# Patient Record
Sex: Male | Born: 1965 | Race: Black or African American | Hispanic: No | Marital: Married | Smoking: Current some day smoker
Health system: Southern US, Community
[De-identification: ages and names within clinical notes are randomized; demographics above are authoritative.]

## PROBLEM LIST (undated history)

## (undated) DIAGNOSIS — J189 Pneumonia, unspecified organism: Secondary | ICD-10-CM

## (undated) DIAGNOSIS — G43901 Migraine, unspecified, not intractable, with status migrainosus: Principal | ICD-10-CM

## (undated) DIAGNOSIS — J449 Chronic obstructive pulmonary disease, unspecified: Secondary | ICD-10-CM

## (undated) HISTORY — PX: FINGER SURGERY: SHX640

## (undated) HISTORY — PX: STOMACH SURGERY: SHX791

## (undated) HISTORY — DX: Migraine, unspecified, not intractable, with status migrainosus: G43.901

---

## 2002-07-28 ENCOUNTER — Emergency Department (HOSPITAL_COMMUNITY): Admission: EM | Admit: 2002-07-28 | Discharge: 2002-07-28 | Payer: Self-pay | Admitting: Emergency Medicine

## 2005-03-30 ENCOUNTER — Emergency Department (HOSPITAL_COMMUNITY): Admission: EM | Admit: 2005-03-30 | Discharge: 2005-03-30 | Payer: Self-pay | Admitting: Emergency Medicine

## 2006-05-20 ENCOUNTER — Emergency Department (HOSPITAL_COMMUNITY): Admission: EM | Admit: 2006-05-20 | Discharge: 2006-05-21 | Payer: Self-pay | Admitting: Emergency Medicine

## 2006-12-09 ENCOUNTER — Emergency Department (HOSPITAL_COMMUNITY): Admission: EM | Admit: 2006-12-09 | Discharge: 2006-12-10 | Payer: Self-pay | Admitting: Emergency Medicine

## 2008-04-08 ENCOUNTER — Emergency Department (HOSPITAL_COMMUNITY): Admission: EM | Admit: 2008-04-08 | Discharge: 2008-04-08 | Payer: Self-pay | Admitting: Emergency Medicine

## 2009-02-12 ENCOUNTER — Emergency Department (HOSPITAL_COMMUNITY): Admission: EM | Admit: 2009-02-12 | Discharge: 2009-02-12 | Payer: Self-pay | Admitting: Emergency Medicine

## 2009-02-17 ENCOUNTER — Inpatient Hospital Stay (HOSPITAL_COMMUNITY): Admission: EM | Admit: 2009-02-17 | Discharge: 2009-02-18 | Payer: Self-pay | Admitting: Emergency Medicine

## 2009-02-23 ENCOUNTER — Inpatient Hospital Stay (HOSPITAL_COMMUNITY): Admission: AD | Admit: 2009-02-23 | Discharge: 2009-02-25 | Payer: Self-pay

## 2009-02-24 ENCOUNTER — Encounter (INDEPENDENT_AMBULATORY_CARE_PROVIDER_SITE_OTHER): Payer: Self-pay | Admitting: General Surgery

## 2009-05-31 ENCOUNTER — Emergency Department (HOSPITAL_COMMUNITY): Admission: EM | Admit: 2009-05-31 | Discharge: 2009-05-31 | Payer: Self-pay | Admitting: Emergency Medicine

## 2009-09-01 ENCOUNTER — Emergency Department (HOSPITAL_COMMUNITY): Admission: EM | Admit: 2009-09-01 | Discharge: 2009-09-01 | Payer: Self-pay | Admitting: Emergency Medicine

## 2010-01-22 ENCOUNTER — Emergency Department (HOSPITAL_COMMUNITY): Admission: EM | Admit: 2010-01-22 | Discharge: 2010-01-22 | Payer: Self-pay | Admitting: Emergency Medicine

## 2010-01-24 ENCOUNTER — Ambulatory Visit (HOSPITAL_COMMUNITY): Admission: RE | Admit: 2010-01-24 | Discharge: 2010-01-24 | Payer: Self-pay | Admitting: Orthopedic Surgery

## 2010-08-02 ENCOUNTER — Emergency Department (HOSPITAL_COMMUNITY): Admission: EM | Admit: 2010-08-02 | Discharge: 2010-08-02 | Payer: Self-pay | Admitting: Emergency Medicine

## 2010-08-22 ENCOUNTER — Emergency Department (HOSPITAL_COMMUNITY): Admission: EM | Admit: 2010-08-22 | Discharge: 2010-08-22 | Payer: Self-pay | Admitting: Emergency Medicine

## 2011-02-21 LAB — COMPREHENSIVE METABOLIC PANEL
ALT: 17 U/L (ref 0–53)
AST: 18 U/L (ref 0–37)
Albumin: 3.5 g/dL (ref 3.5–5.2)
BUN: 7 mg/dL (ref 6–23)
Calcium: 9.1 mg/dL (ref 8.4–10.5)
Creatinine, Ser: 0.9 mg/dL (ref 0.4–1.5)
GFR calc Af Amer: >60 mL/min (ref >60)
Potassium: 4.1 mEq/L (ref 3.5–5.1)
Sodium: 137 mEq/L (ref 135–145)

## 2011-02-21 LAB — DIFFERENTIAL
Basophils Absolute: 0 10*3/uL (ref 0.0–0.1)
Basophils Relative: 1 % (ref 0–1)
Eosinophils Relative: 4 % (ref 0–5)
Lymphocytes Relative: 23 % (ref 12–46)
Lymphs Abs: 1.3 10*3/uL (ref 0.7–4.0)
Neutro Abs: 3.9 10*3/uL (ref 1.7–7.7)
Neutrophils Relative %: 69 % (ref 43–77)

## 2011-02-21 LAB — CBC
HCT: 38.9 % — ABNORMAL LOW (ref 39.0–52.0)
Hemoglobin: 13.4 g/dL (ref 13.0–17.0)
MCV: 90.4 fL (ref 78.0–100.0)
RBC: 4.3 MIL/uL (ref 4.22–5.81)

## 2011-03-15 LAB — URINALYSIS, ROUTINE W REFLEX MICROSCOPIC
Bilirubin Urine: NEGATIVE
Bilirubin Urine: NEGATIVE
Glucose, UA: NEGATIVE mg/dL
Hgb urine dipstick: NEGATIVE
Ketones, ur: NEGATIVE mg/dL
Nitrite: NEGATIVE
Nitrite: NEGATIVE
Protein, ur: NEGATIVE mg/dL
Specific Gravity, Urine: 1.019 (ref 1.005–1.030)
pH: 5.5 (ref 5.0–8.0)

## 2011-03-15 LAB — CBC
Hemoglobin: 14.6 g/dL (ref 13.0–17.0)
Hemoglobin: 15.1 g/dL (ref 13.0–17.0)
MCHC: 35 g/dL (ref 30.0–36.0)
MCV: 90.7 fL (ref 78.0–100.0)
MCV: 91.9 fL (ref 78.0–100.0)
RBC: 4.61 MIL/uL (ref 4.22–5.81)
RBC: 4.77 MIL/uL (ref 4.22–5.81)
RDW: 14.2 % (ref 11.5–15.5)
WBC: 5.1 10*3/uL (ref 4.0–10.5)
WBC: 6.9 10*3/uL (ref 4.0–10.5)
WBC: 7.9 10*3/uL (ref 4.0–10.5)

## 2011-03-15 LAB — COMPREHENSIVE METABOLIC PANEL
ALT: 14 U/L (ref 0–53)
ALT: 19 U/L (ref 0–53)
AST: 15 U/L (ref 0–37)
AST: 25 U/L (ref 0–37)
Alkaline Phosphatase: 69 U/L (ref 39–117)
Alkaline Phosphatase: 82 U/L (ref 39–117)
BUN: 4 mg/dL — ABNORMAL LOW (ref 6–23)
BUN: 7 mg/dL (ref 6–23)
CO2: 27 mEq/L (ref 19–32)
CO2: 26 mEq/L (ref 19–32)
Calcium: 8.9 mg/dL (ref 8.4–10.5)
Calcium: 9.4 mg/dL (ref 8.4–10.5)
Chloride: 104 mEq/L (ref 96–112)
Chloride: 106 mEq/L (ref 96–112)
Creatinine, Ser: 0.91 mg/dL (ref 0.4–1.5)
GFR calc Af Amer: >60 mL/min (ref >60)
GFR calc non Af Amer: >60 mL/min (ref >60)
Glucose, Bld: 85 mg/dL (ref 70–99)
Glucose, Bld: 98 mg/dL (ref 70–99)
Potassium: 3.7 mEq/L (ref 3.5–5.1)
Potassium: 3.8 mEq/L (ref 3.5–5.1)
Sodium: 136 mEq/L (ref 135–145)
Sodium: 141 mEq/L (ref 135–145)
Total Bilirubin: 0.5 mg/dL (ref 0.3–1.2)
Total Bilirubin: 0.5 mg/dL (ref 0.3–1.2)
Total Bilirubin: 0.8 mg/dL (ref 0.3–1.2)
Total Protein: 6.2 g/dL (ref 6.0–8.3)

## 2011-03-15 LAB — DIFFERENTIAL
Basophils Absolute: 0 10*3/uL (ref 0.0–0.1)
Basophils Absolute: 0 10*3/uL (ref 0.0–0.1)
Basophils Relative: 0 % (ref 0–1)
Basophils Relative: 0 % (ref 0–1)
Eosinophils Absolute: 0.4 10*3/uL (ref 0.0–0.7)
Eosinophils Relative: 5 % (ref 0–5)
Eosinophils Relative: 5 % (ref 0–5)
Lymphocytes Relative: 28 % (ref 12–46)
Lymphs Abs: 1.9 10*3/uL (ref 0.7–4.0)
Lymphs Abs: 1.5 10*3/uL (ref 0.7–4.0)
Neutrophils Relative %: 72 % (ref 43–77)

## 2011-03-15 LAB — LIPASE, BLOOD: Lipase: 21 U/L (ref 11–59)

## 2011-03-30 ENCOUNTER — Emergency Department (HOSPITAL_COMMUNITY): Payer: BC Managed Care – PPO

## 2011-03-30 ENCOUNTER — Emergency Department (HOSPITAL_COMMUNITY)
Admission: EM | Admit: 2011-03-30 | Discharge: 2011-03-30 | Disposition: A | Discharge status: Home / Self Care | Payer: BC Managed Care – PPO | Attending: Emergency Medicine | Admitting: Emergency Medicine

## 2011-03-30 DIAGNOSIS — M67919 Unspecified disorder of synovium and tendon, unspecified shoulder: Secondary | ICD-10-CM | POA: Insufficient documentation

## 2011-03-30 DIAGNOSIS — M719 Bursopathy, unspecified: Secondary | ICD-10-CM | POA: Insufficient documentation

## 2011-03-30 DIAGNOSIS — T148XXA Other injury of unspecified body region, initial encounter: Secondary | ICD-10-CM | POA: Insufficient documentation

## 2011-03-30 DIAGNOSIS — F17200 Nicotine dependence, unspecified, uncomplicated: Secondary | ICD-10-CM | POA: Insufficient documentation

## 2011-03-30 DIAGNOSIS — IMO0002 Reserved for concepts with insufficient information to code with codable children: Secondary | ICD-10-CM | POA: Insufficient documentation

## 2011-04-17 NOTE — Discharge Summary (Signed)
NAME:  JUD, FANGUY NO.:  [id number]   MEDICAL RECORD NO.:  [id number]          PATIENT TYPE:  INP   LOCATION:  5127                         FACILITY:  [facility]   PHYSICIAN:  Cherylynn Ridges, M.D.    DATE OF BIRTH:  March 22, 1966   DATE OF ADMISSION:  02/23/2009  DATE OF DISCHARGE:  02/25/2009                               DISCHARGE SUMMARY   CHIEF COMPLAINT/REASON FOR ADMISSION:  Mr. Odriscoll is a male patient  well known to __________ service from initial evaluation last week.  The  patient presented with abdominal and rectal pain.  He had two distinct  problems, the main problem at the time seemed to be more of his rectal  pain.  He had hemorrhoids from having diarrhea for 2-3 weeks and  was  given medication for this problem and given a work note to remain out of  work because of these issues.  He also was found to have abdominal pain  and a HIDA scan did reveal biliary dyskinesia.  He also apparently had  calcified gallstones with normal common bile duct.  This was confirmed  on ultrasound.  The patient's ejection fraction on HIDA scan was 10%.  The cause of these symptoms   Dictation ended at this point.      Allison L. Rennis Harding, N.P.      Cherylynn Ridges, M.D.     ALE/MEDQ  D:  02/25/2009  T:  02/25/2009  Job:  [id number]

## 2011-04-17 NOTE — Op Note (Signed)
NAME:  [name], [name] NO.:  [id number]   MEDICAL RECORD NO.:  [id number]          PATIENT TYPE:  INP   LOCATION:  5127                         FACILITY:  [facility]   PHYSICIAN:  Cherylynn Ridges, M.D.    DATE OF BIRTH:  1966-09-04   DATE OF PROCEDURE:  02/24/2009  DATE OF DISCHARGE:                               OPERATIVE REPORT   PREOPERATIVE DIAGNOSIS:  Symptomatic cholelithiasis and biliary colic.   POSTOPERATIVE DIAGNOSIS:  Symptomatic cholelithiasis and biliary colic.   PROCEDURE:  Laparoscopic cholecystectomy with cholangiogram.   SURGEON:  Cherylynn Ridges, M.D.   ASSISTANT:  Letha Cape, the nurse practitioner.   ANESTHESIA:  General endotracheal.   ESTIMATED BLOOD LOSS:  Less than 20 mL.   COMPLICATION:  None.   CONDITION:  Stable.   FINDINGS:  Some edema in the gallbladder bed.  Normal intraoperative  cholangiogram.   INDICATION FOR OPERATION:  The patient is a 45 year old, readmitted with  abdominal pain in the right upper quadrant associated with nausea,  vomiting, and diarrhea who now comes in for an urgent, but elective  laparoscopic cholecystectomy.   OPERATION:  The patient was taken to the operating room, placed on table  in the supine position.  After an adequate general endotracheal  anesthetic was administered, he was prepped and draped in usual sterile  manner exposing the midline in the right upper quadrant.   A supraumbilical midline incision was made using #15 blade and taken  down to the midline fascia.  The fascia was grabbed with Kocher clamps,  and we incised the fascia between the clamps using 15 blade into the  preperitoneal space.  We bluntly dissected down through the  preperitoneal space into the peritoneal cavity and once this was done, a  pursestring suture of 0 Vicryl was passed around the fascial opening,  securing and a Hasson cannula which was subsequently passed.  Once the  Hasson cannula was secured in place, we  insufflated carbon dioxide gas  up to a maximum intraabdominal pressure of 15 mmHg.   Once the abdomen was filled with gas, we placed 2 right upper quadrant 5-  mm cannulas and a subxiphoid, eleven 12-mm cannula under direct vision.  With all cannulas in place, the patient was placed in reverse  Trendelenburg and left side was tilted down and dissection begun.   The dome of the gallbladder which was enlarged, was retracted towards  the right upper quadrant and into the abdominal wall.  The second grasp  was passed on the infundibulum.  We dissected out the peritoneum  overlying the triangle of Calot, and hepatoduodenal triangle exposing  the cystic duct and the cystic artery.  The cystic artery was clipped x3  along the remaining side and once along the gallbladder side.  The  cystic duct was clipped along the gallbladder side then we made a  cholecystodochotomy using laparoscopic scissors in which a cook catheter  which had been passed through the anterior abdominal wall was passed in  order to perform the cholangiogram.   The cholangiogram showed good flowing to the duodenum, good proximal  filling, no filling defects, and no dilatation.   Once cholangiogram was completed, the clip was removed securing the  catheter in place.  We endoclip the distal cystic duct x2 and then  transected it.   We dissected out the gallbladder from its bed with minimal difficulty.  We used an EndoCatch bag to retrieve it from the supraumbilical site.  Because of its large size, we had to open the fascia slightly, however,  we were able to close that with a pursestring suture which was in place.   Once the gallbladder was removed, then supraumbilical fascial site was  closed.  We irrigated the abdominal cavity and gallbladder fossa with  saline.  All fluid and gas was aspirated from the site.  There was  minimal bleeding, no bowel staining.  Once all gas and fluid were  removed, we removed all  cannulas.   The skin was closed with all sites using running subcuticular stitch of  4-0 Monocryl except for the 2 lateral cannula sites, which were closed  with Dermabond only.  Marcaine 0.5% with epi was injected at all sites.  Dermabond, Steri-Strips, and Tegaderm was used to complete all dressing.  All needle counts, sponge counts, and instrument counts were correct.      Cherylynn Ridges, M.D.  Electronically Signed     JOW/MEDQ  D:  02/24/2009  T:  02/25/2009  Job:  [id number]
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
## 2011-04-17 NOTE — Discharge Summary (Signed)
NAME:  [name], [name] NO.:  [id number]   MEDICAL RECORD NO.:  [id number]          PATIENT TYPE:  INP   LOCATION:  5127                         FACILITY:  [facility]   PHYSICIAN:  Cherylynn Ridges, M.D.    DATE OF BIRTH:  [birth year]   DATE OF ADMISSION:  02/23/2009  DATE OF DISCHARGE:  02/25/2009                               DISCHARGE SUMMARY   CHIEF COMPLAINT/REASON FOR ADMISSION:  [name] is a 45 year old male  patient who was evaluated last week at the hospital for two different  problems, the first being abdominal pain related to cholelithiasis and  biliary dyskinesia with ultrasound demonstrated stones with no problems  with the common bile duct and HIDA scan demonstrating a low ejection  fraction of 10%.  The patient had no acute cholecystitis.  He was also  having difficulty with diarrhea and hemorrhoids and was having  significant pain from this problem, was given medications for this and  was given a work note to remain out of work and plans were to discharge  the patient home and return this week for elective cholecystectomy.  Please note that because we had a high acuity since this last week in  the hospital, the patient was unable to have operative procedure within  the 24-hour time period and otherwise was not deemed appropriate for  remaining in the hospital.   The patient was readmitted on the stated date with plans to undergo  cholecystectomy procedure.   ADMITTING DIAGNOSIS:  Biliary dyskinesia with cholelithiasis,  symptomatic.   HOSPITAL COURSE:  The patient was admitted on February 23, 2009 in  preparation for laparoscopic cholecystectomy.  He was subsequently taken  to the OR on through February 24, 2009 where he underwent a laparoscopic  cholecystectomy.  In the immediate postop period when he first returned  to the surgical floor, he did have some skin bleeding that required  dressing reinforcement but by postop day #1 this had resolved.  The  patient's main complaint on postop day #1 was inadequate pain control.  Otherwise, he was tolerating a diet and was deemed appropriate for  discharge home.   FINAL DISCHARGE DIAGNOSES:  1. Abdominal pain secondary to biliary dyskinesia and cholelithiasis,      symptomatic.  2. Status post uncomplicated laparoscopic cholecystectomy with normal      intraoperative cholangiogram.   DISCHARGE MEDICATIONS:  The patient will resume the following home  medications.  Belladonna phenobarbital 1 tablet as needed, to control your diarrhea.   NEW MEDICATIONS:  1. Percocet 5/325 one to two tablets every 4 hours as needed for pain,      #40 dispensed with no refills.  2. Ibuprofen 600 mg t.i.d. p.r.n. pain.  May take an additional      Percocet, always take with food or snacks.   INSTRUCTIONS:  The patient is to return to work in 2 weeks, note has  been given covering the patient from the period of February 18, 2009  through the next 2 weeks.   DIET:  No restrictions.   WOUND CARE:  Remove bandages over the abdomen in  5 days.   ACTIVITY:  Increase activity slowly and walk up steps.  May shower.  No  lifting greater than 10 pounds for 2 weeks.  No driving 1 week.   ADDITIONAL INSTRUCTIONS:  You are call the surgeon's office if;  A.  Fever greater than or equal to 101.5 degrees Fahrenheit.  B.  New or increased belly pain.  C.  Redness, drainage from wounds.  D.  Nausea, vomiting, or diarrhea.   FOLLOWUP APPOINTMENTS:  The patient [facility] to [facility] Peter Chapel[facility]-C [facility] 08, 2009 at 2:30 p.m.  He is to arrive at  2:15 p.m.      [name], N.P.      Cherylynn Ridges, M.D.  Electronically Signed    ALE/MEDQ  D:  02/25/2009  T:  02/25/2009  Job:  [id number]
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
## 2011-11-21 DIAGNOSIS — G43909 Migraine, unspecified, not intractable, without status migrainosus: Secondary | ICD-10-CM | POA: Insufficient documentation

## 2011-11-22 ENCOUNTER — Emergency Department (HOSPITAL_COMMUNITY)
Admission: EM | Admit: 2011-11-22 | Discharge: 2011-11-22 | Disposition: A | Discharge status: Home / Self Care | Payer: BC Managed Care – PPO | Attending: Emergency Medicine | Admitting: Emergency Medicine

## 2011-11-22 ENCOUNTER — Encounter: Payer: Self-pay | Admitting: Emergency Medicine

## 2011-11-22 DIAGNOSIS — G43909 Migraine, unspecified, not intractable, without status migrainosus: Secondary | ICD-10-CM

## 2011-11-22 MED ORDER — METOCLOPRAMIDE HCL 5 MG/ML IJ SOLN
10.0000 mg | Freq: Once | INTRAMUSCULAR | Status: DC
  Filled 2011-11-22: qty 2

## 2011-11-22 MED ORDER — DEXAMETHASONE SODIUM PHOSPHATE 10 MG/ML IJ SOLN
10.0000 mg | Freq: Once | INTRAMUSCULAR | Status: DC
  Filled 2011-11-22: qty 1

## 2011-11-22 MED ORDER — METOCLOPRAMIDE HCL 5 MG/ML IJ SOLN
10.0000 mg | Freq: Once | INTRAMUSCULAR | Status: AC
  Administered 2011-11-22: 10 mg via INTRAMUSCULAR

## 2011-11-22 MED ORDER — DIPHENHYDRAMINE HCL 50 MG/ML IJ SOLN
25.0000 mg | Freq: Once | INTRAMUSCULAR | Status: AC
  Administered 2011-11-22: 25 mg via INTRAMUSCULAR

## 2011-11-22 MED ORDER — DIPHENHYDRAMINE HCL 50 MG/ML IJ SOLN
25.0000 mg | Freq: Once | INTRAMUSCULAR | Status: DC
  Filled 2011-11-22: qty 1

## 2011-11-22 MED ORDER — DEXAMETHASONE SODIUM PHOSPHATE 10 MG/ML IJ SOLN
10.0000 mg | Freq: Once | INTRAMUSCULAR | Status: AC
  Administered 2011-11-22: 10 mg via INTRAMUSCULAR

## 2011-11-22 NOTE — ED Notes (Signed)
Pt here with c/o headache times 2 days associated with n/v/d s well. Pt admits to having a hx of migraines

## 2011-11-22 NOTE — ED Provider Notes (Signed)
History     CSN: [id number] Arrival date & time: 11/22/2011 12:01 AM   First MD Initiated Contact with Patient 11/22/11 0252      Chief Complaint  Patient presents with  . Headache     HPI  History provided by the pt. The patient is a 45 year old male with history of migraine headaches since the age of 79, who presents today with complaints of similar headaches began 2 days ago. It appeared gradually and is increased is now improved at home with over-the-counter pain medications were adjusted symptoms are similar to past headaches. Patient reports some photophobia. Patient reports having one episode of vomiting with headache for began. Patient has no other significant past medical history. the patient denies fever, chills, sweats.    Past Medical History  Diagnosis Date  . Migraine     Past Surgical History  Procedure Date  . Rotator cuff repair   . Cholecystectomy   . Finger surgery     Family History  Problem Relation Age of Onset  . Hypertension Mother   . Tuberculosis Mother   . Diabetes Mother     History  Substance Use Topics  . Smoking status: Current Some Day Smoker  . Smokeless tobacco: Not on file  . Alcohol Use: No      Review of Systems  Constitutional: Negative for fever and chills.  HENT: Negative for sinus pressure.   Respiratory: Negative for cough and shortness of breath.   Gastrointestinal: Positive for nausea and vomiting.  Neurological: Positive for headaches.  All other systems reviewed and are negative.    Allergies  Morphine and related  Home Medications  No current outpatient prescriptions on file.  BP 124/59  Pulse 76  Temp(Src) 97.4 F (36.3 C) (Oral)  Resp 20  SpO2 100%  Physical Exam  Nursing note and vitals reviewed. Constitutional: He is oriented to person, place, and time. He appears well-developed and well-nourished. No distress.  HENT:  Head: Normocephalic and atraumatic.  Eyes: Conjunctivae and EOM are  normal. Pupils are equal, round, and reactive to light.  Neck: Normal range of motion. Neck supple.       No meningeal signs  Cardiovascular: Normal rate, regular rhythm and normal heart sounds.   Pulmonary/Chest: Effort normal and breath sounds normal.  Abdominal: Soft.  Musculoskeletal: He exhibits no edema and no tenderness.  Neurological: He is alert and oriented to person, place, and time. He has normal strength. No cranial nerve deficit or sensory deficit. Coordination and gait normal.  Skin: Skin is warm.  Psychiatric: He has a normal mood and affect. His behavior is normal.    ED Course  Procedures (including critical care time)    1. Migraine headache       MDM  3:00AM patient seen and evaluated. Patient in no acute distress   Pt having slight improvement.  Pt offered additional tx for HA but pt requests to return home to rest there.  Pt with no red flags for symptoms today and normal exam.     Angus Seller, PA 11/22/11 [phone]

## 2011-11-22 NOTE — ED Notes (Signed)
Pt ready for d/c  Stable. Ambulatory. No issues

## 2011-11-22 NOTE — ED Notes (Signed)
Pt states about 2 days ago at work his head started hurting  Pt states the pain feels like a beating in his head  Pt has hx of migraines  Pt states they come periodically  Pt states he has had nausea, vomiting and diarrhea that started 2 days ago

## 2011-11-23 NOTE — ED Provider Notes (Signed)
Medical screening examination/treatment/procedure(s) were performed by non-physician practitioner and as supervising physician I was immediately available for consultation/collaboration.   Vida Roller, MD 11/23/11 [phone]

## 2011-12-04 HISTORY — PX: CHOLECYSTECTOMY: SHX55

## 2011-12-04 HISTORY — PX: ROTATOR CUFF REPAIR: SHX139

## 2013-02-21 ENCOUNTER — Encounter (HOSPITAL_COMMUNITY): Payer: Self-pay | Admitting: *Deleted

## 2013-02-21 ENCOUNTER — Emergency Department (HOSPITAL_COMMUNITY)
Admission: EM | Admit: 2013-02-21 | Discharge: 2013-02-21 | Disposition: A | Discharge status: Home / Self Care | Payer: BC Managed Care – PPO | Attending: Emergency Medicine | Admitting: Emergency Medicine

## 2013-02-21 DIAGNOSIS — G43909 Migraine, unspecified, not intractable, without status migrainosus: Secondary | ICD-10-CM

## 2013-02-21 DIAGNOSIS — F17200 Nicotine dependence, unspecified, uncomplicated: Secondary | ICD-10-CM | POA: Insufficient documentation

## 2013-02-21 MED ORDER — METOCLOPRAMIDE HCL 5 MG/ML IJ SOLN
10.0000 mg | Freq: Once | INTRAMUSCULAR | Status: AC
  Administered 2013-02-21: 10 mg via INTRAVENOUS
  Filled 2013-02-21: qty 2

## 2013-02-21 MED ORDER — DIPHENHYDRAMINE HCL 50 MG/ML IJ SOLN
25.0000 mg | Freq: Once | INTRAMUSCULAR | Status: AC
  Administered 2013-02-21: 25 mg via INTRAVENOUS
  Filled 2013-02-21: qty 1

## 2013-02-21 MED ORDER — PREDNISONE 50 MG PO TABS: 50.0000 mg | ORAL_TABLET | Freq: Every day | ORAL | Status: DC

## 2013-02-21 MED ORDER — KETOROLAC TROMETHAMINE 30 MG/ML IJ SOLN
30.0000 mg | Freq: Once | INTRAMUSCULAR | Status: AC
  Administered 2013-02-21: 30 mg via INTRAVENOUS
  Filled 2013-02-21: qty 1

## 2013-02-21 NOTE — ED Notes (Signed)
Patient complains of left eye blurred vision and headache concentrated over his left eye. Patient had an MRI done yesterday at [facility]. Patient has DVD with him.

## 2013-02-21 NOTE — ED Provider Notes (Signed)
History     CSN: [id number]  Arrival date & time 02/21/13  Ernestina Columbia   First MD Initiated Contact with Patient 02/21/13 1931      Chief Complaint  Patient presents with  . Migraine    (Consider location/radiation/quality/duration/timing/severity/associated sxs/prior treatment) HPI Patient presents emergency department with migraine headache.  Patient, states, that he has had a long-standing history of migraine headaches.  Patient, states, that he currently is being treated by his primary Dr. for these and had a recent MRI.  Patient denies visual changes, weakness, numbness, fever, chest pain, shortness of breath, abdominal pain, or vomiting.  Patient, states, that he did not take anything prior to arrival for his symptoms.  Patient, states his headache has Been ongoing for the last 2 weeks.  Past Medical History  Diagnosis Date  . Migraine     Past Surgical History  Procedure Laterality Date  . Rotator cuff repair    . Cholecystectomy    . Finger surgery      Family History  Problem Relation Age of Onset  . Hypertension Mother   . Tuberculosis Mother   . Diabetes Mother     History  Substance Use Topics  . Smoking status: Current Some Day Smoker    Types: Cigarettes  . Smokeless tobacco: Not on file  . Alcohol Use: No      Review of Systems All other systems negative except as documented in the HPI. All pertinent positives and negatives as reviewed in the HPI.  Allergies  Morphine and related  Home Medications   Current Outpatient Rx  Name  Route  Sig  Dispense  Refill  . topiramate (TOPAMAX) 50 MG tablet   Oral   Take 50 mg by mouth 2 (two) times daily.           BP 117/79  Pulse 53  Temp(Src) 97.3 F (36.3 C) (Oral)  Resp 16  SpO2 96%  Physical Exam  Nursing note and vitals reviewed. Constitutional: He is oriented to person, place, and time. He appears well-developed and well-nourished.  HENT:  Head: Normocephalic and atraumatic.  Mouth/Throat:  Oropharynx is clear and moist.  Eyes: EOM are normal. Pupils are equal, round, and reactive to light.  Neck: Normal range of motion. Neck supple.  Cardiovascular: Normal rate, regular rhythm and normal heart sounds.  Exam reveals no gallop and no friction rub.   No murmur heard. Pulmonary/Chest: Effort normal and breath sounds normal.  Neurological: He is alert and oriented to person, place, and time. He exhibits normal muscle tone. Coordination normal.  Skin: Skin is warm and dry.    ED Course  Procedures (including critical care time) Patient is feeling better.  Patient will be referred back to his primary Dr. for further evaluation and care.  Told to return here for any worsening in his condition   MDM          Carlyle Dolly, PA-C 02/21/13 2245

## 2013-02-21 NOTE — ED Notes (Signed)
Patient states his headache has improved after medication administered.

## 2013-02-21 NOTE — ED Notes (Signed)
Pt c/o migraine x 2 weeks, was seen by PCP and given rx but has had no relief.  Had MRI yesterday and has CD with him

## 2013-02-21 NOTE — ED Provider Notes (Signed)
Medical screening examination/treatment/procedure(s) were performed by non-physician practitioner and as supervising physician I was immediately available for consultation/collaboration.   Richardean Canal, MD 02/21/13 [phone]

## 2013-02-26 ENCOUNTER — Ambulatory Visit (INDEPENDENT_AMBULATORY_CARE_PROVIDER_SITE_OTHER): Payer: BC Managed Care – PPO | Admitting: Neurology

## 2013-02-26 ENCOUNTER — Encounter: Payer: Self-pay | Admitting: Neurology

## 2013-02-26 VITALS — BP 146/91 | HR 68 | Ht 68.0 in | Wt 209.0 lb

## 2013-02-26 DIAGNOSIS — G43901 Migraine, unspecified, not intractable, with status migrainosus: Secondary | ICD-10-CM

## 2013-02-26 HISTORY — DX: Migraine, unspecified, not intractable, with status migrainosus: G43.901

## 2013-02-26 MED ORDER — DIVALPROEX SODIUM ER 500 MG PO TB24: 500.0000 mg | ORAL_TABLET | Freq: Every day | ORAL | Status: DC

## 2013-02-26 MED ORDER — VERAPAMIL HCL ER 120 MG PO CP24: 120.0000 mg | ORAL_CAPSULE | Freq: Every day | ORAL | Status: DC

## 2013-02-26 MED ORDER — RIZATRIPTAN BENZOATE 10 MG PO TBDP: 10.0000 mg | ORAL_TABLET | ORAL | Status: DC | PRN

## 2013-02-26 NOTE — Progress Notes (Signed)
[name] is a 47 years old right-handed African American male, referred by his primary care physician Ms. Sheliah Plane for evaluation of headaches  He reported a history of headaches since age 38, he could remember dived down into a swimming pool, when he came back up, he noticed left retro-orbital area severe pounding headaches, he had frequent headache since, left retro-orbital area severe bony headaches with associated light noise sensitivity, nauseous, he could remember his mother took him back-and-forth between the hospital when he was a teenager.  He usually has headaches lasting 2-3 weeks, daily basis, can also have 1 month in between without a headache, he did not notice anything trigger,  He now has this left retro-orbital area headache for 3 weeks now, constant pounding headaches, getting worse at evening time, he works at [employer], require lifting 60-200 pounds, his headache worsening by movement, he presented to the emergency room, his headache failed to improve by Percocet, oxycodone, over-the-counter Aleve, Tylenol,  MRI of the brain noticed mild bihemisphere minimum changes, of the brain was normal  He could not recall the name of the preventive medication he tried before, but has never tried triptan treatment  He has mild gait difficulty due to right knee pain, he has 6 children, 67 years old daughter suffered leukemia, also stroke, had heart failure, is on the list for heart transplant.  Review of Systems  Out of a complete 14 system review, the patient complains of only the following symptoms, and all other reviewed systems are negative.   Constitutional:   N/A Cardiovascular:  N/A Ear/Nose/Throat:  N/A Skin: N/A Eyes: Blurry vision, left eye pain, Respiratory: N/A Gastroitestinal: N/A    Hematology/Lymphatic:  N/A Musculoskeletal:N/A Endocrine:  N/A Neurological: Dizziness Psychiatric:    N/A  PHYSICAL EXAMINATOINS:  Generalized: In no acute distress  Neck: Supple, no carotid  bruits   Cardiac: Regular rate rhythm  Pulmonary: Clear to auscultation bilaterally  Musculoskeletal: No deformity  Neurological examination  Mentation: Alert oriented to time, place, history taking, and causual conversation, light-sensitive  Cranial nerve II-XII: Pupils were equal round reactive to light extraocular movements were full, visual field were full on confrontational test. facial sensation and strength were normal. hearing was intact to finger rubbing bilaterally. Uvula tongue midline.  head turning and shoulder shrug and were normal and symmetric.Tongue protrusion into cheek strength was normal.  Motor: normal tone, bulk and strength.  Sensory: Intact to fine touch, pinprick, preserved vibratory sensation, and proprioception at toes.  Coordination: Normal finger to nose, heel-to-shin bilaterally there was no truncal ataxia  Gait: Rising up from seated position without assistance, normal stance, without trunk ataxia, moderate stride, mild atalgic due to right knee pain, good arm swing, smooth turning, able to perform tiptoe, and heel walking without difficulty.   Romberg signs: Negative  Deep tendon reflexes: Brachioradialis 2/2, biceps 2/2, triceps 2/2, patellar 2/2, Achilles 2/2, plantar responses were flexor bilaterally.  Assessment and plan:  47 years old [place] American male with frequent migraine headaches, essentially normal neurological examination, MRI of brain MRA of brain.  1, preventive medications Depakote XR 500 mg every night, verapamil extended release 120 mg daily 2. abortive treatment, [name] 10 mg as needed 3, return to clinic in one month

## 2013-02-26 NOTE — Patient Instructions (Signed)
Take depakote xr and verapmil daily. [name] as needed.

## 2013-03-03 ENCOUNTER — Telehealth: Payer: Self-pay

## 2013-03-03 NOTE — Telephone Encounter (Signed)
Patient wife called and asked me to call her husband because he was still having migraines daily and he needed apt. Called patient times two and left message for him to call.

## 2013-03-17 ENCOUNTER — Telehealth: Payer: Self-pay | Admitting: Neurology

## 2013-03-17 NOTE — Telephone Encounter (Signed)
Message copied by Elisha Headland on Tue Mar 17, 2013  8:30 AM ------      Message from: Eugenie Birks      Created: Tue Mar 17, 2013  7:38 AM      Regarding: pt wife came in office today       Pt wife showed up in clinic this morning at 7:30 this morning and states the pt is getting worst. He was in the bed all day yesterday and has been sick, not eating, and vomiting and wants to be seen as soon as possible about these symptoms. She also states he could not see out of one of his eyes. I bumped the appt up by a week but they still want to be seen sooner than that please call as soon as possible. ------

## 2013-03-17 NOTE — Telephone Encounter (Signed)
See message about patient being sick below.  Patient was seen on 02-26-13 and wants to be seen sooner.  Please call patient [phone]  Or [phone].

## 2013-03-17 NOTE — Telephone Encounter (Signed)
I have called him, failed to reach him. Please give him an appt for tomorrow. April 16th.

## 2013-03-18 ENCOUNTER — Ambulatory Visit (INDEPENDENT_AMBULATORY_CARE_PROVIDER_SITE_OTHER): Payer: BC Managed Care – PPO | Admitting: Neurology

## 2013-03-18 ENCOUNTER — Telehealth: Payer: Self-pay

## 2013-03-18 ENCOUNTER — Encounter: Payer: Self-pay | Admitting: Neurology

## 2013-03-18 VITALS — BP 122/83 | HR 79 | Wt 210.0 lb

## 2013-03-18 DIAGNOSIS — G43909 Migraine, unspecified, not intractable, without status migrainosus: Secondary | ICD-10-CM

## 2013-03-18 MED ORDER — SUMATRIPTAN SUCCINATE 6 MG/0.5ML ~~LOC~~ SOLN: 6.0000 mg | SUBCUTANEOUS | Status: DC | PRN

## 2013-03-18 MED ORDER — MAGNESIUM OXIDE 400 MG PO TABS: 400.0000 mg | ORAL_TABLET | Freq: Two times a day (BID) | ORAL | Status: DC

## 2013-03-18 MED ORDER — KETOROLAC TROMETHAMINE 60 MG/2ML IM SOLN
60.0000 mg | Freq: Once | INTRAMUSCULAR | Status: AC
  Administered 2013-03-18: 60 mg via INTRAMUSCULAR

## 2013-03-18 MED ORDER — METHYLPREDNISOLONE (PAK) 4 MG PO TABS: ORAL_TABLET | ORAL | Status: DC

## 2013-03-18 MED ORDER — RIBOFLAVIN 100 MG PO TABS: 100.0000 mg | ORAL_TABLET | Freq: Two times a day (BID) | ORAL | Status: DC

## 2013-03-18 NOTE — Patient Instructions (Addendum)
Patient told to rest before work [employer].

## 2013-03-18 NOTE — Telephone Encounter (Signed)
Patient has apt with Dr.Yan  03-18-2013 Dr.Yan worked him in.

## 2013-03-18 NOTE — Progress Notes (Signed)
[name] is a 47 years old right-handed African American male, referred by his primary care physician Dr. Sheliah Plane for evaluation of headaches  He reported a history of headaches since age 19, he could remember dived down into a swimming pool, when he came back up, he noticed left retro-orbital area severe pounding headaches, he had frequent headache since, left retro-orbital area severe bony headaches with associated light noise sensitivity, nauseous, he could remember his mother took him back-and-forth between the hospital when he was a teenager.  He usually has headaches lasting 2-3 weeks, daily basis, can also have 1 month in between without a headache, he did not notice anything trigger,  He now has this left retro-orbital area headache since early March 2014, constant pounding headaches, with superposed electric shocking, getting worse at evening time, he works at [employer], require lifting 60-200 pounds, his headache worsening by movement, he presented to the emergency room, his headache failed to improve by Percocet, oxycodone, over-the-counter Aleve, Tylenol,  MRI of the brain noticed mild bihemisphere minimum changes, of the brain was normal  He could not recall the name of the preventive medication he tried before, but has never tried triptan treatment  He has mild gait difficulty due to right knee pain, he has 6 children, 57 years old daughter suffered leukemia, also stroke, had heart failure, is on the list for heart transplant.  UPDATE Aprill 16th 2014: Verapamil make his chest hurt, he stopped it, he has never tried Depakote, worry about the side effects, he complains today the left retro-orbital area severe pounding headaches, intermixed with sharp electricity shock sensation, 10 out of 10, he is tired of medications, has been taking off work for a few days, very frustrated about his pain, but he is also very suspicious about taking medications. He Is not sure [name] when necessary has helped  him    Review of Systems  Out of a complete 14 system review, the patient complains of only the following symptoms, and all other reviewed systems are negative.   Constitutional:   N/A Cardiovascular:  N/A Ear/Nose/Throat:  N/A Skin: N/A Eyes: Blurry vision, left eye pain, Respiratory: N/A Gastroitestinal: N/A    Hematology/Lymphatic:  N/A Musculoskeletal:N/A Endocrine:  N/A Neurological: Dizziness Psychiatric:    N/A  PHYSICAL EXAMINATOINS:  Generalized: In no acute distress  Neck: Supple, no carotid bruits   Cardiac: Regular rate rhythm  Pulmonary: Clear to auscultation bilaterally  Musculoskeletal: No deformity  Neurological examination  Mentation: Alert oriented to time, place, history taking, and causual conversation, light-sensitive  Cranial nerve II-XII: Pupils were equal round reactive to light extraocular movements were full, visual field were full on confrontational test. facial sensation and strength were normal. hearing was intact to finger rubbing bilaterally. Uvula tongue midline.  head turning and shoulder shrug and were normal and symmetric.Tongue protrusion into cheek strength was normal.  Motor: normal tone, bulk and strength.  Sensory: Intact to fine touch, pinprick, preserved vibratory sensation, and proprioception at toes.  Coordination: Normal finger to nose, heel-to-shin bilaterally there was no truncal ataxia  Gait: Rising up from seated position without assistance, normal stance, without trunk ataxia, moderate stride, mild atalgic due to right knee pain, good arm swing, smooth turning, able to perform tiptoe, and heel walking without difficulty.   Romberg signs: Negative  Deep tendon reflexes: Brachioradialis 2/2, biceps 2/2, triceps 2/2, patellar 2/2, Achilles 2/2, plantar responses were flexor bilaterally.  Assessment and plan: 47 years old [place] American male with frequent migraine headaches, essentially  normal neurological examination,  MRI of brain, MRA of brain.  1, preventive medications Depakote XR 500 mg every night, also magnesium oxide 500 mg twice a day, riboflavin 100mg  bid. 2. imitrex sq prn,  3. Medrol Pak 4. return to clinic in one month

## 2013-03-18 NOTE — Progress Notes (Signed)
Patient here for visit with Dr. Terrace Arabia.  Order for Toradol 60mg  IM.  Under aseptic technique Toradol 60mg  given IM in left deltoid.  Tolerated and band aid applied.

## 2013-03-25 ENCOUNTER — Ambulatory Visit: Payer: Self-pay | Admitting: Neurology

## 2013-03-25 ENCOUNTER — Ambulatory Visit: Payer: BC Managed Care – PPO | Admitting: Neurology

## 2013-03-31 ENCOUNTER — Ambulatory Visit: Payer: BC Managed Care – PPO | Admitting: Neurology

## 2013-06-17 ENCOUNTER — Emergency Department (HOSPITAL_COMMUNITY): Payer: Self-pay

## 2013-06-17 ENCOUNTER — Encounter (HOSPITAL_COMMUNITY): Payer: Self-pay | Admitting: *Deleted

## 2013-06-17 ENCOUNTER — Emergency Department (HOSPITAL_COMMUNITY)
Admission: EM | Admit: 2013-06-17 | Discharge: 2013-06-18 | Disposition: A | Discharge status: Home / Self Care | Payer: Self-pay | Attending: Emergency Medicine | Admitting: Emergency Medicine

## 2013-06-17 DIAGNOSIS — G43901 Migraine, unspecified, not intractable, with status migrainosus: Secondary | ICD-10-CM | POA: Insufficient documentation

## 2013-06-17 DIAGNOSIS — F17200 Nicotine dependence, unspecified, uncomplicated: Secondary | ICD-10-CM | POA: Insufficient documentation

## 2013-06-17 DIAGNOSIS — Y9372 Activity, wrestling: Secondary | ICD-10-CM | POA: Insufficient documentation

## 2013-06-17 DIAGNOSIS — S46911A Strain of unspecified muscle, fascia and tendon at shoulder and upper arm level, right arm, initial encounter: Secondary | ICD-10-CM

## 2013-06-17 DIAGNOSIS — X58XXXA Exposure to other specified factors, initial encounter: Secondary | ICD-10-CM | POA: Insufficient documentation

## 2013-06-17 DIAGNOSIS — Y929 Unspecified place or not applicable: Secondary | ICD-10-CM | POA: Insufficient documentation

## 2013-06-17 DIAGNOSIS — IMO0002 Reserved for concepts with insufficient information to code with codable children: Secondary | ICD-10-CM | POA: Insufficient documentation

## 2013-06-17 DIAGNOSIS — Z7982 Long term (current) use of aspirin: Secondary | ICD-10-CM | POA: Insufficient documentation

## 2013-06-17 MED ORDER — NAPROXEN 500 MG PO TABS: 500.0000 mg | ORAL_TABLET | Freq: Two times a day (BID) | ORAL | Status: DC

## 2013-06-17 NOTE — ED Notes (Signed)
Pt arm wrestling early this morning; injured right elbow; increased pain all day

## 2013-06-17 NOTE — ED Provider Notes (Signed)
History    CSN: [id number] Arrival date & time 06/17/13  2240  First MD Initiated Contact with Patient 06/17/13 2340     Chief Complaint  Patient presents with  . Arm Injury   HPI  History provided by the patient. Patient is a 47 year old male with history of right rotator cuff surgery and biceps tendon rupture who presents with complaints of right elbow pain and injury. Patient states he had returned home from work earlier this evening and was with several friends. They began having arm wrestling contest and patient was competing when he started to have "tearing" and pain to his right elbow as he was losing. He did not hear any sudden or loud pop it. He has continued to have pain and soreness to his medial forearm and elbow area. Pain is worse with some movements of the elbow and wrist. Denies any reduced range of motion. Denies any weakness or numbness in the hand or fingers. Denies any swelling or skin change. He did not use any treatment for his symptoms prior to arrival. No other aggravating or alleviating factors. No other associated symptoms.    Past Medical History  Diagnosis Date  . Migraine   . Migraine with status migrainosus 02/26/2013   Past Surgical History  Procedure Laterality Date  . Rotator cuff repair Right 2013  . Cholecystectomy  2013  . Finger surgery     Family History  Problem Relation Age of Onset  . Hypertension Mother   . Tuberculosis Mother   . Diabetes Mother    History  Substance Use Topics  . Smoking status: Current Some Day Smoker -- 1.00 packs/day    Types: Cigarettes  . Smokeless tobacco: Not on file  . Alcohol Use: No    Review of Systems  Neurological: Negative for weakness and numbness.  All other systems reviewed and are negative.    Allergies  Morphine and related  Home Medications   Current Outpatient Rx  Name  Route  Sig  Dispense  Refill  . aspirin 81 MG tablet   Oral   Take 81 mg by mouth daily.         .  divalproex (DEPAKOTE ER) 500 MG 24 hr tablet   Oral   Take 1 tablet (500 mg total) by mouth daily.   30 tablet   6   . magnesium oxide (MAG-OX 400) 400 MG tablet   Oral   Take 1 tablet (400 mg total) by mouth 2 (two) times daily.   60 tablet   12   . methylPREDNIsolone (MEDROL DOSPACK) 4 MG tablet      follow package directions   21 tablet   0   . Riboflavin 100 MG TABS   Oral   Take 1 tablet (100 mg total) by mouth 2 (two) times daily.   60 tablet   12   . rizatriptan ([name]-MLT) 10 MG disintegrating tablet   Oral   Take 1 tablet (10 mg total) by mouth as needed for migraine. May repeat in 2 hours if needed   15 tablet   6   . SUMAtriptan (IMITREX) 6 MG/0.5ML SOLN injection   Subcutaneous   Inject 0.5 mLs (6 mg total) into the skin every 2 (two) hours as needed for migraine or headache. F   12 vial   6   . verapamil (VERELAN PM) 120 MG 24 hr capsule   Oral   Take 1 capsule (120 mg total) by mouth at bedtime.  30 capsule   6    BP 130/88  Pulse 67  Temp(Src) 98 F (36.7 C) (Oral)  Resp 18  Ht 5\' 9"  (1.753 m)  Wt 210 lb (95.255 kg)  BMI 31 kg/m2  SpO2 98% Physical Exam  Nursing note and vitals reviewed. Constitutional: He is oriented to person, place, and time. He appears well-developed and well-nourished. No distress.  HENT:  Head: Normocephalic.  Cardiovascular: Normal rate and regular rhythm.   Pulmonary/Chest: Effort normal and breath sounds normal. No respiratory distress. He has no wheezes. He has no rales.  Abdominal: Soft.  Musculoskeletal: Normal range of motion. He exhibits tenderness. He exhibits no edema.  There is tenderness at the proximal flexor group of the right forearm and tenderness to the medial epicondyle area. No gross deformities. No swelling. Full range of motion at the elbow and wrist. Normal distal pulses, cap refill and fingers. Normal sensation to light touch to the fingers.  Chronic appearing change to the medial proximal  right bicep area consistent with history of previous injuries and surgeries. Small surgical scars over the mesh right shoulder consistent with history of surgery. Otherwise normal  Neurological: He is alert and oriented to person, place, and time.  Skin: Skin is warm. No erythema.  Psychiatric: He has a normal mood and affect. His behavior is normal.    ED Course  Procedures      Dg Elbow Complete Right  06/17/2013   *RADIOLOGY REPORT*  Clinical Data: Right elbow injury.  RIGHT ELBOW - COMPLETE 3+ VIEW  Comparison: None.  Findings: Four views of the right elbow were obtained.  No definite joint effusion.  The elbow is located.  No evidence for acute fracture. Small ossifications along the lateral distal humerus appear chronic.  IMPRESSION: No acute bony abnormality to the right elbow.   Original Report Authenticated By: Richarda Overlie, M.D.   1. Elbow strain, right, initial encounter       MDM  11:40PM patient seen and evaluated. He appears well no acute distress.  X-rays unremarkable. Will have patient use rest, ice, compression and elevation to help with symptoms.  Angus Seller, PA-C 06/18/13 0001

## 2013-06-18 NOTE — ED Notes (Signed)
Pt given rx x 1 for naprosyn and ice pack for home use

## 2013-06-18 NOTE — ED Provider Notes (Signed)
Medical screening examination/treatment/procedure(s) were performed by non-physician practitioner and as supervising physician I was immediately available for consultation/collaboration.   Lyanne Co, MD 06/18/13 [phone]

## 2014-01-12 ENCOUNTER — Emergency Department (HOSPITAL_COMMUNITY)
Admission: EM | Admit: 2014-01-12 | Discharge: 2014-01-12 | Disposition: A | Discharge status: Home / Self Care | Payer: Self-pay | Attending: Emergency Medicine | Admitting: Emergency Medicine

## 2014-01-12 ENCOUNTER — Encounter (HOSPITAL_COMMUNITY): Payer: Self-pay | Admitting: Emergency Medicine

## 2014-01-12 DIAGNOSIS — Z8679 Personal history of other diseases of the circulatory system: Secondary | ICD-10-CM | POA: Insufficient documentation

## 2014-01-12 DIAGNOSIS — B029 Zoster without complications: Secondary | ICD-10-CM | POA: Insufficient documentation

## 2014-01-12 DIAGNOSIS — F17200 Nicotine dependence, unspecified, uncomplicated: Secondary | ICD-10-CM | POA: Insufficient documentation

## 2014-01-12 DIAGNOSIS — Z79899 Other long term (current) drug therapy: Secondary | ICD-10-CM | POA: Insufficient documentation

## 2014-01-12 MED ORDER — VALGANCICLOVIR HCL 450 MG PO TABS: 900.0000 mg | ORAL_TABLET | Freq: Three times a day (TID) | ORAL | Status: DC

## 2014-01-12 MED ORDER — HYDROCODONE-ACETAMINOPHEN 5-325 MG PO TABS: 1.0000 | ORAL_TABLET | ORAL | Status: DC | PRN

## 2014-01-12 MED ORDER — ACYCLOVIR 200 MG PO CAPS
200.0000 mg | ORAL_CAPSULE | Freq: Once | ORAL | Status: AC
  Administered 2014-01-12: 200 mg via ORAL
  Filled 2014-01-12 (×2): qty 1

## 2014-01-12 NOTE — ED Notes (Signed)
Patient presents with c/o rash to his right upper chest and around to his right upper back.  No drainage at this time

## 2014-01-12 NOTE — ED Notes (Signed)
Patient presents with rash to the upper right chest that travels around under his ar to his right upper back.  Small pustules in that area.  Noted to have 1 area draining.

## 2014-01-12 NOTE — ED Provider Notes (Signed)
CSN: [id number]     Arrival date & time 01/12/14  2211 History  This chart was scribed for Peter Pel, PA-C, working with Peter Porter, MD by [name], ED Scribe. This patient was seen in room TR05C/TR05C and the patient's care was started at 10:57 PM.    Chief Complaint  Patient presents with  . Rash     Patient is a 48 y.o. male presenting with rash. The history is provided by the patient. No language interpreter was used.  Rash   HPI Comments: [name] is a 48 y.o. male who presents to the Emergency Department complaining of a constant rash to his right upper chest that radiates around to his right back that that appeared today. He states that he has had had associated stabbing, constant pain to the area for two days prior to the rash appearing. He originally thought that he had injured him self at work prior to the rash appearing. He states that he was helping take care of his mother who had shingles three months ago. He denies any similar symptoms previously.  Past Medical History  Diagnosis Date  . Migraine   . Migraine with status migrainosus 02/26/2013   Past Surgical History  Procedure Laterality Date  . Rotator cuff repair Right 2013  . Cholecystectomy  2013  . Finger surgery     Family History  Problem Relation Age of Onset  . Hypertension Mother   . Tuberculosis Mother   . Diabetes Mother    History  Substance Use Topics  . Smoking status: Current Some Day Smoker -- 1.00 packs/day    Types: Cigarettes  . Smokeless tobacco: Not on file  . Alcohol Use: No    Review of Systems  Skin: Positive for rash.  All other systems reviewed and are negative.    Allergies  Morphine and related  Home Medications   Current Outpatient Rx  Name  Route  Sig  Dispense  Refill  . Multiple Vitamin (MULTIVITAMIN WITH MINERALS) TABS tablet   Oral   Take 1 tablet by mouth daily.         Marland Kitchen HYDROcodone-acetaminophen (NORCO/VICODIN) 5-325 MG per tablet  Oral   Take 1-2 tablets by mouth every 4 (four) hours as needed.   20 tablet   0   . valGANciclovir (VALCYTE) 450 MG tablet   Oral   Take 2 tablets (900 mg total) by mouth 3 (three) times daily.   21 tablet   0    Triage Vitals: BP 131/83  Pulse 91  Temp(Src) 98.2 F (36.8 C) (Oral)  Resp 18  Ht 5\' 9"  (1.753 m)  Wt 210 lb (95.255 kg)  BMI 31.00 kg/m2  SpO2 96%  Physical Exam  Nursing note and vitals reviewed. Constitutional: He is oriented to person, place, and time. He appears well-developed and well-nourished. No distress.  HENT:  Head: Normocephalic and atraumatic.  Eyes: EOM are normal.  Neck: Neck supple. No tracheal deviation present.  Cardiovascular: Normal rate.   Pulmonary/Chest: Effort normal. No respiratory distress.  Musculoskeletal: Normal range of motion.  Neurological: He is alert and oriented to person, place, and time.  Skin: Skin is warm and dry.  Rash on right side T2 dermatome anteriorly and posteriorly.   Psychiatric: He has a normal mood and affect. His behavior is normal.    ED Course  Procedures (including critical care time)  DIAGNOSTIC STUDIES: Oxygen Saturation is 96% on room air, adequate by my interpretation.  COORDINATION OF CARE: 11:02 PM -Clinical suspicion of shingles, especially with positive sick contact. Will discharge with prescription for antiviral and pain medication. Patient verbalizes understanding and agrees with treatment plan.  Labs Review Labs Reviewed - No data to display Imaging Review No results found.  EKG Interpretation   None       MDM   Final diagnoses:  Shingles    Uncomplicated Valgancyclovir 900mg  TID for 7 days Vicodin for pain  47 y.o.[name]'s evaluation in the Emergency Department is complete. It has been determined that no acute conditions requiring further emergency intervention are present at this time. The patient/guardian have been advised of the diagnosis and plan. We have  discussed signs and symptoms that warrant return to the ED, such as changes or worsening in symptoms.  Vital signs are stable at discharge. Filed Vitals:   01/12/14 2229  BP: 131/83  Pulse: 91  Temp: 98.2 F (36.8 C)  Resp: 18    Patient/guardian has voiced understanding and agreed to follow-up with the PCP or specialist.    Peter Matasiffany G Lesslie Mckeehan, PA-C 01/12/14 2312

## 2014-01-12 NOTE — Discharge Instructions (Signed)
Shingles Shingles (herpes zoster) is an infection that is caused by the same virus that causes chickenpox (varicella). The infection causes a painful skin rash and fluid-filled blisters, which eventually break open, crust over, and heal. It may occur in any area of the body, but it usually affects only one side of the body or face. The pain of shingles usually lasts about 1 month. However, some people with shingles may develop long-term (chronic) pain in the affected area of the body. Shingles often occurs many years after the person had chickenpox. It is more common:  In people older than 50 years.  In people with weakened immune systems, such as those with HIV, AIDS, or cancer.  In people taking medicines that weaken the immune system, such as transplant medicines.  In people under great stress. CAUSES  Shingles is caused by the varicella zoster virus (VZV), which also causes chickenpox. After a person is infected with the virus, it can remain in the person's body for years in an inactive state (dormant). To cause shingles, the virus reactivates and breaks out as an infection in a nerve root. The virus can be spread from person to person (contagious) through contact with open blisters of the shingles rash. It will only spread to people who have not had chickenpox. When these people are exposed to the virus, they may develop chickenpox. They will not develop shingles. Once the blisters scab over, the person is no longer contagious and cannot spread the virus to others. SYMPTOMS  Shingles shows up in stages. The initial symptoms may be pain, itching, and tingling in an area of the skin. This pain is usually described as burning, stabbing, or throbbing.In a few days or weeks, a painful red rash will appear in the area where the pain, itching, and tingling were felt. The rash is usually on one side of the body in a band or belt-like pattern. Then, the rash usually turns into fluid-filled blisters. They  will scab over and dry up in approximately 2 3 weeks. Flu-like symptoms may also occur with the initial symptoms, the rash, or the blisters. These may include:  Fever.  Chills.  Headache.  Upset stomach. DIAGNOSIS  Your caregiver will perform a skin exam to diagnose shingles. Skin scrapings or fluid samples may also be taken from the blisters. This sample will be examined under a microscope or sent to a lab for further testing. TREATMENT  There is no specific cure for shingles. Your caregiver will likely prescribe medicines to help you manage the pain, recover faster, and avoid long-term problems. This may include antiviral drugs, anti-inflammatory drugs, and pain medicines. HOME CARE INSTRUCTIONS   Take a cool bath or apply cool compresses to the area of the rash or blisters as directed. This may help with the pain and itching.   Only take over-the-counter or prescription medicines as directed by your caregiver.   Rest as directed by your caregiver.  Keep your rash and blisters clean with mild soap and cool water or as directed by your caregiver.  Do not pick your blisters or scratch your rash. Apply an anti-itch cream or numbing creams to the affected area as directed by your caregiver.  Keep your shingles rash covered with a loose bandage (dressing).  Avoid skin contact with:  Babies.   Pregnant women.   Children with eczema.   Elderly people with transplants.   People with chronic illnesses, such as leukemia or AIDS.   Wear loose-fitting clothing to help ease   the pain of material rubbing against the rash.  Keep all follow-up appointments with your caregiver.If the area involved is on your face, you may receive a referral for follow-up to a specialist, such as an eye doctor (ophthalmologist) or an ear, nose, and throat (ENT) doctor. Keeping all follow-up appointments will help you avoid eye complications, chronic pain, or disability.  SEEK IMMEDIATE MEDICAL  CARE IF:   You have facial pain, pain around the eye area, or loss of feeling on one side of your face.  You have ear pain or ringing in your ear.  You have loss of taste.  Your pain is not relieved with prescribed medicines.   Your redness or swelling spreads.   You have more pain and swelling.  Your condition is worsening or has changed.   You have a feveror persistent symptoms for more than 2 3 days.  You have a fever and your symptoms suddenly get worse. MAKE SURE YOU:  Understand these instructions.  Will watch your condition.  Will get help right away if you are not doing well or get worse. Document Released: 11/19/2005 Document Revised: 08/13/2012 Document Reviewed: 07/03/2012 [employer] Patient Information 2014 [employer].  

## 2014-01-17 NOTE — ED Provider Notes (Signed)
Medical screening examination/treatment/procedure(s) were performed by non-physician practitioner and as supervising physician I was immediately available for consultation/collaboration.  EKG Interpretation   None         Angela Vazguez, MD 01/17/14 0708 

## 2016-12-10 ENCOUNTER — Encounter (HOSPITAL_COMMUNITY): Payer: Self-pay

## 2016-12-10 ENCOUNTER — Emergency Department (HOSPITAL_COMMUNITY): Payer: BLUE CROSS/BLUE SHIELD

## 2016-12-10 ENCOUNTER — Emergency Department (HOSPITAL_COMMUNITY)
Admission: EM | Admit: 2016-12-10 | Discharge: 2016-12-10 | Disposition: A | Discharge status: Home / Self Care | Payer: BLUE CROSS/BLUE SHIELD | Attending: Emergency Medicine | Admitting: Emergency Medicine

## 2016-12-10 DIAGNOSIS — F17210 Nicotine dependence, cigarettes, uncomplicated: Secondary | ICD-10-CM | POA: Insufficient documentation

## 2016-12-10 DIAGNOSIS — Z79899 Other long term (current) drug therapy: Secondary | ICD-10-CM | POA: Insufficient documentation

## 2016-12-10 DIAGNOSIS — B9789 Other viral agents as the cause of diseases classified elsewhere: Secondary | ICD-10-CM

## 2016-12-10 DIAGNOSIS — R062 Wheezing: Secondary | ICD-10-CM | POA: Diagnosis not present

## 2016-12-10 DIAGNOSIS — J069 Acute upper respiratory infection, unspecified: Secondary | ICD-10-CM | POA: Insufficient documentation

## 2016-12-10 DIAGNOSIS — R519 Headache, unspecified: Secondary | ICD-10-CM

## 2016-12-10 DIAGNOSIS — R51 Headache: Secondary | ICD-10-CM | POA: Insufficient documentation

## 2016-12-10 DIAGNOSIS — R6889 Other general symptoms and signs: Secondary | ICD-10-CM

## 2016-12-10 MED ORDER — PROMETHAZINE HCL 25 MG PO TABS: 25.0000 mg | ORAL_TABLET | Freq: Four times a day (QID) | ORAL | 0 refills | Status: DC | PRN

## 2016-12-10 MED ORDER — PREDNISONE 20 MG PO TABS: 40.0000 mg | ORAL_TABLET | Freq: Every day | ORAL | 0 refills | Status: DC

## 2016-12-10 MED ORDER — NAPROXEN 500 MG PO TABS: 500.0000 mg | ORAL_TABLET | Freq: Two times a day (BID) | ORAL | 0 refills | Status: DC

## 2016-12-10 MED ORDER — ALBUTEROL SULFATE HFA 108 (90 BASE) MCG/ACT IN AERS: 1.0000 | INHALATION_SPRAY | Freq: Four times a day (QID) | RESPIRATORY_TRACT | 0 refills | Status: DC | PRN

## 2016-12-10 NOTE — ED Provider Notes (Signed)
[facility]-EMERGENCY DEPT Provider Note   CSN: [id number] Arrival date & time: 12/10/16  1745     History   Chief Complaint Chief Complaint  Patient presents with  . Cough    HPI [name] is a 51 y.o. male the past medical history of migraine headaches. Patient has had a recent URI with cough, body aches, chills. He states that his symptoms have improved over the past 4 days. He's had some mild associated wheezing but denies any history of reactive airway or asthma. The patient has had migraine headache over the past 4 days. He states that nothing seems to make his headaches improved. He is followed by Dr. Glenna Durand at [facility] neurology. The patient states that mostly he just wanted to make sure that he didn't have pneumonia or some other significant infection.Denies photophobia, phonophobia, UL throbbing, N/V, visual changes, stiff neck, neck pain, rash, or "thunderclap" onset.    HPI  Past Medical History:  Diagnosis Date  . Migraine   . Migraine with status migrainosus 02/26/2013    Patient Active Problem List   Diagnosis Date Noted  . Migraine with status migrainosus 02/26/2013    Past Surgical History:  Procedure Laterality Date  . CHOLECYSTECTOMY  2013  . FINGER SURGERY    . ROTATOR CUFF REPAIR Right 2013       Home Medications    Prior to Admission medications   Medication Sig Start Date End Date Taking? Authorizing Provider  albuterol (PROVENTIL HFA;VENTOLIN HFA) 108 (90 Base) MCG/ACT inhaler Inhale 1-2 puffs into the lungs every 6 (six) hours as needed for wheezing or shortness of breath. 12/10/16   Arthor Captain, PA-C  HYDROcodone-acetaminophen (NORCO/VICODIN) 5-325 MG per tablet Take 1-2 tablets by mouth every 4 (four) hours as needed. 01/12/14   Marlon Pel, PA-C  Multiple Vitamin (MULTIVITAMIN WITH MINERALS) TABS tablet Take 1 tablet by mouth daily.    Historical Provider, MD  naproxen (NAPROSYN) 500 MG tablet Take 1 tablet (500 mg total) by mouth 2 (two)  times daily. 12/10/16   Arthor Captain, PA-C  predniSONE (DELTASONE) 20 MG tablet Take 2 tablets (40 mg total) by mouth daily. 12/10/16   Arthor Captain, PA-C  promethazine (PHENERGAN) 25 MG tablet Take 1 tablet (25 mg total) by mouth every 6 (six) hours as needed for nausea or vomiting. 12/10/16   Arthor Captain, PA-C  valGANciclovir (VALCYTE) 450 MG tablet Take 2 tablets (900 mg total) by mouth 3 (three) times daily. 01/12/14   Marlon Pel, PA-C    Family History Family History  Problem Relation Age of Onset  . Hypertension Mother   . Tuberculosis Mother   . Diabetes Mother     Social History Social History  Substance Use Topics  . Smoking status: Current Some Day Smoker    Packs/day: 1.00    Types: Cigarettes  . Smokeless tobacco: Never Used  . Alcohol use No     Allergies   Morphine and related   Review of Systems Review of Systems  Ten systems reviewed and are negative for acute change, except as noted in the HPI.   Physical Exam Updated Vital Signs BP 117/79 (BP Location: Right Arm)   Pulse 72   Temp 98.8 F (37.1 C) (Oral)   Resp 17   Ht 5\' 9"  (1.753 m)   Wt 93 kg   SpO2 100%   BMI 30.27 kg/m   Physical Exam  Constitutional: He is oriented to person, place, and time. He appears well-developed and well-nourished.  No distress.  HENT:  Head: Normocephalic and atraumatic.  Mouth/Throat: Oropharynx is clear and moist.  Eyes: Conjunctivae and EOM are normal. Pupils are equal, round, and reactive to light. No scleral icterus.  No horizontal, vertical or rotational nystagmus  Neck: Normal range of motion. Neck supple.  Full active and passive ROM without pain No midline or paraspinal tenderness No nuchal rigidity or meningeal signs  Cardiovascular: Normal rate, regular rhythm and intact distal pulses.   Pulmonary/Chest: Effort normal. No respiratory distress. He has wheezes. He has no rales.  Abdominal: Soft. Bowel sounds are normal. There is no tenderness.  There is no rebound and no guarding.  Musculoskeletal: Normal range of motion.  Lymphadenopathy:    He has no cervical adenopathy.  Neurological: He is alert and oriented to person, place, and time. No cranial nerve deficit. He exhibits normal muscle tone. Coordination normal.  Mental Status:  Alert, oriented, thought content appropriate. Speech fluent without evidence of aphasia. Able to follow 2 step commands without difficulty.  Cranial Nerves:  II:  Peripheral visual fields grossly normal, pupils equal, round, reactive to light III,IV, VI: ptosis not present, extra-ocular motions intact bilaterally  V,VII: smile symmetric, facial light touch sensation equal VIII: hearing grossly normal bilaterally  IX,X: midline uvula rise  XI: bilateral shoulder shrug equal and strong XII: midline tongue extension  Motor:  5/5 in upper and lower extremities bilaterally including strong and equal grip strength and dorsiflexion/plantar flexion Sensory: Pinprick and light touch normal in all extremities.  Cerebellar: normal finger-to-nose with bilateral upper extremities Gait: normal gait and balance CV: distal pulses palpable throughout   Skin: Skin is warm and dry. No rash noted. He is not diaphoretic.  Psychiatric: He has a normal mood and affect. His behavior is normal. Judgment and thought content normal.  Nursing note and vitals reviewed.    ED Treatments / Results  Labs (all labs ordered are listed, but only abnormal results are displayed) Labs Reviewed - No data to display  EKG  EKG Interpretation None       Radiology Dg Chest 2 View  Result Date: 12/10/2016 CLINICAL DATA:  PT C/O PRODUCTIVE COUGH, MIGRAINE, FEVER, CHILLS, CHEST CONGESTION, AND BODY ACHES SINCE Friday. Smoker- 1 pack/day EXAM: CHEST  2 VIEW COMPARISON:  01/22/2010 FINDINGS: The heart size and mediastinal contours are within normal limits. Both lungs are clear. The visualized skeletal structures are unremarkable.  IMPRESSION: No active cardiopulmonary disease. Electronically Signed   By: Gaylyn Rong M.D.   On: 12/10/2016 19:49    Procedures Procedures (including critical care time)  Medications Ordered in ED Medications - No data to display   Initial Impression / Assessment and Plan / ED Course  I have reviewed the triage vital signs and the nursing notes.  Pertinent labs & imaging results that were available during my care of the patient were reviewed by me and considered in my medical decision making (see chart for details).  Clinical Course     Patient with bad headache. Patient also has a URI with wheezing. Will discharge with medications listed below. Patient is follow-up with his neurologist for his headaches. Chest x-ray is negative. Pierce safe for discharge at this time Final Clinical Impressions(s) / ED Diagnoses   Final diagnoses:  Flu-like symptoms  Bad headache  Expiratory wheezing  Viral URI with cough    New Prescriptions New Prescriptions   ALBUTEROL (PROVENTIL HFA;VENTOLIN HFA) 108 (90 BASE) MCG/ACT INHALER    Inhale 1-2 puffs into the  lungs every 6 (six) hours as needed for wheezing or shortness of breath.   NAPROXEN (NAPROSYN) 500 MG TABLET    Take 1 tablet (500 mg total) by mouth 2 (two) times daily.   PREDNISONE (DELTASONE) 20 MG TABLET    Take 2 tablets (40 mg total) by mouth daily.   PROMETHAZINE (PHENERGAN) 25 MG TABLET    Take 1 tablet (25 mg total) by mouth every 6 (six) hours as needed for nausea or vomiting.     Arthor CaptainAbigail Kaydon Creedon, PA-C 12/10/16 [id number]    Alvira MondayErin Schlossman, MD 12/12/16 1247

## 2016-12-10 NOTE — ED Notes (Signed)
PT DISCHARGED. INSTRUCTIONS AND PRESCRIPTIONS GIVEN. AAOX4. PT IN NO APPARENT DISTRESS OR PAIN. THE OPPORTUNITY TO ASK QUESTIONS WAS PROVIDED. 

## 2016-12-10 NOTE — ED Notes (Signed)
Pt ambulated to x-ray.

## 2016-12-10 NOTE — ED Triage Notes (Signed)
PT C/O PRODUCTIVE COUGH, MIGRAINE, FEVER, CHILLS, CHEST CONGESTION, AND BODY ACHES SINCE Friday.

## 2016-12-10 NOTE — Discharge Instructions (Signed)
You are having a headache. No specific cause was found today for your headache. It may have been a migraine or other cause of headache. Stress, anxiety, fatigue, and depression are common triggers for headaches. Your headache today does not appear to be life-threatening or require hospitalization, but often the exact cause of headaches is not determined in the emergency department. Therefore, follow-up with your doctor is very important to find out what may have caused your headache, and whether or not you need any further diagnostic testing or treatment. Sometimes headaches can appear benign (not harmful), but then more serious symptoms can develop which should prompt an immediate re-evaluation by your doctor or the emergency department. SEEK MEDICAL ATTENTION IF: You develop possible problems with medications prescribed.  The medications don't resolve your headache, if it recurs , or if you have multiple episodes of vomiting or can't take fluids. You have a change from the usual headache. RETURN IMMEDIATELY IF you develop a sudden, severe headache or confusion, become poorly responsive or faint, develop a fever above 100.52F or problem breathing, have a change in speech, vision, swallowing, or understanding, or develop new weakness, numbness, tingling, incoordination, or have a seizure.  You appear to have an upper respiratory infection (URI). An upper respiratory tract infection, or cold, is a viral infection of the air passages leading to the lungs. It is contagious and can be spread to others, especially during the first 3 or 4 days. It cannot be cured by antibiotics or other medicines. RETURN IMMEDIATELY IF you develop shortness of breath, confusion or altered mental status, a new rash, become dizzy, faint, or poorly responsive, or are unable to be cared for at home.

## 2017-02-26 ENCOUNTER — Encounter (HOSPITAL_COMMUNITY): Payer: Self-pay

## 2017-02-26 ENCOUNTER — Emergency Department (HOSPITAL_COMMUNITY): Payer: BLUE CROSS/BLUE SHIELD

## 2017-02-26 ENCOUNTER — Emergency Department (HOSPITAL_COMMUNITY)
Admission: EM | Admit: 2017-02-26 | Discharge: 2017-02-26 | Disposition: A | Discharge status: Home / Self Care | Payer: BLUE CROSS/BLUE SHIELD | Attending: Emergency Medicine | Admitting: Emergency Medicine

## 2017-02-26 DIAGNOSIS — R0789 Other chest pain: Secondary | ICD-10-CM

## 2017-02-26 DIAGNOSIS — R071 Chest pain on breathing: Secondary | ICD-10-CM | POA: Diagnosis present

## 2017-02-26 DIAGNOSIS — F17210 Nicotine dependence, cigarettes, uncomplicated: Secondary | ICD-10-CM | POA: Diagnosis not present

## 2017-02-26 LAB — CBC
HEMATOCRIT: 40.7 % (ref 39.0–52.0)
Hemoglobin: 14.2 g/dL (ref 13.0–17.0)
MCH: 30.5 pg (ref 26.0–34.0)
MCHC: 34.9 g/dL (ref 30.0–36.0)
MCV: 87.5 fL (ref 78.0–100.0)
PLATELETS: 295 10*3/uL (ref 150–400)
RBC: 4.65 MIL/uL (ref 4.22–5.81)
RDW: 14.4 % (ref 11.5–15.5)
WBC: 5.5 10*3/uL (ref 4.0–10.5)

## 2017-02-26 LAB — BASIC METABOLIC PANEL
Anion gap: 9 (ref 5–15)
BUN: 6 mg/dL (ref 6–20)
CO2: 29 mmol/L (ref 22–32)
Calcium: 9.4 mg/dL (ref 8.9–10.3)
Chloride: 102 mmol/L (ref 101–111)
Creatinine, Ser: 1.08 mg/dL (ref 0.61–1.24)
GFR calc Af Amer: >60 mL/min (ref >60)
Glucose, Bld: 102 mg/dL — ABNORMAL HIGH (ref 65–99)
POTASSIUM: 3.9 mmol/L (ref 3.5–5.1)
SODIUM: 140 mmol/L (ref 135–145)

## 2017-02-26 LAB — I-STAT TROPONIN, ED: Troponin i, poc: 0.01 ng/mL (ref 0.00–0.08)

## 2017-02-26 MED ORDER — KETOROLAC TROMETHAMINE 60 MG/2ML IM SOLN
60.0000 mg | Freq: Once | INTRAMUSCULAR | Status: AC
  Administered 2017-02-26: 60 mg via INTRAMUSCULAR
  Filled 2017-02-26: qty 2

## 2017-02-26 MED ORDER — LIDOCAINE 5 % EX PTCH: 1.0000 | MEDICATED_PATCH | CUTANEOUS | 0 refills | Status: DC

## 2017-02-26 MED ORDER — NAPROXEN 500 MG PO TABS: 500.0000 mg | ORAL_TABLET | Freq: Two times a day (BID) | ORAL | 0 refills | Status: DC

## 2017-02-26 NOTE — ED Triage Notes (Signed)
Pt presents to the ed with complaints of upper back and left sided chest pain that is worse with a deep breath, he states that he heard a popping and then started having that pain. Denies any other symptoms.

## 2017-02-26 NOTE — ED Provider Notes (Signed)
[facility]-EMERGENCY DEPT Provider Note   CSN: [id number] Arrival date & time: 02/26/17  1442     History   Chief Complaint Chief Complaint  Patient presents with  . Back Pain    HPI [name] is a 51 y.o. male.  The history is provided by the patient. No language interpreter was used.  Back Pain      [name] is a 51 y.o. male who presents to the Emergency Department complaining of back pain.  He reports a week to week and a half of left-sided thoracic back pain. Symptoms began when he was sneezing. He reports 2 weeks of nonproductive cough. He has pain in his thoracic back and left axilla. Pain is worse with deep breaths as well as with bending and rotation of the trunk. He denies any known injuries. No fevers, abdominal pain, nausea, vomiting, leg swelling or pain. No recent surgeries or immobilization. He is a heavy smoker but denies any additional problems. No history of DVT or PE.  Past Medical History:  Diagnosis Date  . Migraine   . Migraine with status migrainosus 02/26/2013    Patient Active Problem List   Diagnosis Date Noted  . Migraine with status migrainosus 02/26/2013    Past Surgical History:  Procedure Laterality Date  . CHOLECYSTECTOMY  2013  . FINGER SURGERY    . ROTATOR CUFF REPAIR Right 2013  . STOMACH SURGERY         Home Medications    Prior to Admission medications   Medication Sig Start Date End Date Taking? Authorizing Provider  lidocaine (LIDODERM) 5 % Place 1 patch onto the skin daily. Remove & Discard patch within 12 hours or as directed by MD 02/26/17   Tilden FossaElizabeth Roarke Marciano, MD  naproxen (NAPROSYN) 500 MG tablet Take 1 tablet (500 mg total) by mouth 2 (two) times daily. 02/26/17   Tilden FossaElizabeth Janett Kamath, MD    Family History Family History  Problem Relation Age of Onset  . Hypertension Mother   . Tuberculosis Mother   . Diabetes Mother     Social History Social History  Substance Use Topics  . Smoking status: Current Some Day  Smoker    Packs/day: 1.00    Types: Cigarettes  . Smokeless tobacco: Never Used  . Alcohol use No     Allergies   Morphine and related   Review of Systems Review of Systems  Musculoskeletal: Positive for back pain.  All other systems reviewed and are negative.    Physical Exam Updated Vital Signs BP 134/90   Pulse 77   Temp 98.8 F (37.1 C) (Oral)   Resp 18   Ht 5\' 9"  (1.753 m)   Wt 200 lb (90.7 kg)   SpO2 100%   BMI 29.53 kg/m   Physical Exam  Constitutional: He is oriented to person, place, and time. He appears well-developed and well-nourished.  HENT:  Head: Normocephalic and atraumatic.  Cardiovascular: Normal rate and regular rhythm.   No murmur heard. Pulmonary/Chest: Effort normal and breath sounds normal. No respiratory distress.  Tender to palpation over the left posterior thorax and left axilla without any crepitus, deformity, or rash.  Abdominal: Soft. There is no tenderness. There is no rebound and no guarding.  Musculoskeletal: He exhibits no edema or tenderness.  Neurological: He is alert and oriented to person, place, and time.  Normal gait. 5 out of 5 strength in all 4 extremities  Skin: Skin is warm and dry.  Psychiatric: He has a  normal mood and affect. His behavior is normal.  Nursing note and vitals reviewed.    ED Treatments / Results  Labs (all labs ordered are listed, but only abnormal results are displayed) Labs Reviewed  BASIC METABOLIC PANEL - Abnormal; Notable for the following:       Result Value   Glucose, Bld 102 (*)    All other components within normal limits  CBC  I-STAT TROPOININ, ED    EKG  EKG Interpretation  Date/Time:  Tuesday February 26 2017 15:31:21 EDT Ventricular Rate:  78 PR Interval:  168 QRS Duration: 88 QT Interval:  356 QTC Calculation: 405 R Axis:   94 Text Interpretation:  Normal sinus rhythm Rightward axis Left ventricular hypertrophy Abnormal ECG Confirmed by Lincoln Brigham [phone]) on 02/26/2017 8:12:48  PM       Radiology Dg Chest 2 View  Result Date: 02/26/2017 CLINICAL DATA:  Left-sided posterior pleuritic chest pain and shortness of breath for the past 5 days. Onset of symptoms began after sneezing often and tried to suppress a sneeze EXAM: CHEST  2 VIEW COMPARISON:  Chest x-ray of December 10, 2016 FINDINGS: The lungs are adequately inflated. There is no focal infiltrate. There is no pleural effusion, pneumothorax, or pneumomediastinum. The heart and pulmonary vascularity are normal. The mediastinum is normal in width. The trachea is midline. The bony thorax exhibits no acute abnormality. IMPRESSION: There is acute cardiopulmonary abnormality. No abnormality of the thoracic cage is observed. If there is palpable tenderness over the left ribs, a left rib series may be a useful next imaging step. Electronically Signed   By: David  [place] M.D.   On: 02/26/2017 16:08   Dg Ribs Unilateral W/chest Left  Result Date: 02/26/2017 CLINICAL DATA:  Rib pain for 1 week EXAM: LEFT RIBS AND CHEST - 3+ VIEW COMPARISON:  02/26/2017 FINDINGS: Three views left ribs submitted. No infiltrate or pulmonary edema. No left rib fracture is identified. No pneumothorax. IMPRESSION: Negative. Electronically Signed   By: Natasha Mead M.D.   On: 02/26/2017 20:57    Procedures Procedures (including critical care time)  Medications Ordered in ED Medications  ketorolac (TORADOL) injection 60 mg (not administered)     Initial Impression / Assessment and Plan / ED Course  I have reviewed the triage vital signs and the nursing notes.  Pertinent labs & imaging results that were available during my care of the patient were reviewed by me and considered in my medical decision making (see chart for details).     Patient here for evaluation of nontraumatic chest/thoracic back pain. Pain is reproducible on examination with palpation, range of motion and deep breaths. No evidence of acute pneumonia, fracture, pneumothorax. No  respiratory distress on examination. Presentation is not consistent with ACS, dissection, referred splenic pain. Presentation is not consistent with PE, perk negative. Counseled patient on home care for chest wall pain with rest, NSAIDs, PCP follow-up and return precautions. Also discussed smoking cessation.  Final Clinical Impressions(s) / ED Diagnoses   Final diagnoses:  Chest wall pain    New Prescriptions New Prescriptions   LIDOCAINE (LIDODERM) 5 %    Place 1 patch onto the skin daily. Remove & Discard patch within 12 hours or as directed by MD   NAPROXEN (NAPROSYN) 500 MG TABLET    Take 1 tablet (500 mg total) by mouth 2 (two) times daily.     Tilden Fossa, MD 02/26/17 2124

## 2017-11-13 ENCOUNTER — Other Ambulatory Visit: Payer: Self-pay

## 2017-11-13 ENCOUNTER — Ambulatory Visit (INDEPENDENT_AMBULATORY_CARE_PROVIDER_SITE_OTHER): Payer: BLUE CROSS/BLUE SHIELD

## 2017-11-13 ENCOUNTER — Ambulatory Visit (HOSPITAL_COMMUNITY)
Admission: EM | Admit: 2017-11-13 | Discharge: 2017-11-13 | Disposition: A | Discharge status: Home / Self Care | Payer: BLUE CROSS/BLUE SHIELD | Attending: Family Medicine | Admitting: Family Medicine

## 2017-11-13 ENCOUNTER — Encounter (HOSPITAL_COMMUNITY): Payer: Self-pay | Admitting: *Deleted

## 2017-11-13 DIAGNOSIS — M549 Dorsalgia, unspecified: Secondary | ICD-10-CM

## 2017-11-13 DIAGNOSIS — M546 Pain in thoracic spine: Secondary | ICD-10-CM

## 2017-11-13 MED ORDER — NAPROXEN 500 MG PO TABS: 500.0000 mg | ORAL_TABLET | Freq: Two times a day (BID) | ORAL | 0 refills | Status: DC

## 2017-11-13 MED ORDER — TRAMADOL HCL 50 MG PO TABS: 50.0000 mg | ORAL_TABLET | Freq: Four times a day (QID) | ORAL | 0 refills | Status: DC | PRN

## 2017-11-13 NOTE — Discharge Instructions (Signed)
Be aware, pain medications may cause drowsiness. Please do not drive, operate heavy machinery or make important decisions while on this medication, it can cloud your judgement.  

## 2017-11-13 NOTE — ED Triage Notes (Signed)
Per pt he fell this morning on ice and landed on his bottom, per pt the center of his back hurts really bad, per pt he feels discomfort when he twists lft or right,

## 2017-11-18 NOTE — ED Provider Notes (Signed)
[facility]   [id number] 11/13/17 Arrival Time: 1535  ASSESSMENT & PLAN:  1. Mid back pain     Meds ordered this encounter  Medications  . naproxen (NAPROSYN) 500 MG tablet    Sig: Take 1 tablet (500 mg total) by mouth 2 (two) times daily.    Dispense:  14 tablet    Refill:  0  . traMADol (ULTRAM) 50 MG tablet    Sig: Take 1 tablet (50 mg total) by mouth every 6 (six) hours as needed.    Dispense:  15 tablet    Refill:  0   Medication sedation precautions given. Ensure good ROM. Will f/u with PCP if needed. Reviewed expectations re: course of current medical issues. Questions answered. Outlined signs and symptoms indicating need for more acute intervention. Patient verbalized understanding. After Visit Summary given.   SUBJECTIVE:  [name] is a 51 y.o. male who presents with complaint of low back discomfort. Onset abrupt beginning today after slipping on ice and falling on his buttocks. Discomfort described as soreness without radiation "over middle of my back." No extremity sensation changes or weakness. Certain movements exacerbate. No self treatment. History of back discomfort; on and off. Normal bowel/bladder habits.  ROS: As per HPI. All other systems negative.   OBJECTIVE:  Vitals:   11/13/17 1702  BP: 129/79  Pulse: 82  Temp: 98.2 F (36.8 C)  TempSrc: Oral  SpO2: 98%    General appearance: alert; no distress Lungs: CTAB Abdomen: soft, non-tender; bowel sounds normal; no masses or organomegaly; no guarding or rebound tenderness Back: no gross deformities; poorly localized tenderness over thoracic spine Extremities: no cyanosis or edema; symmetrical with no gross deformities Skin: warm and dry Neurologic: normal gait; normal symmetric reflexes; normal LE strength and sensation Psychological: alert and cooperative; normal mood and affect  Imaging: Dg Thoracic Spine 2 View  Result Date: 11/13/2017 CLINICAL DATA:  Fall with back pain  EXAM: THORACIC SPINE 2 VIEWS COMPARISON:  None. FINDINGS: There is no evidence of thoracic spine fracture. Alignment is normal. No other significant bone abnormalities are identified. IMPRESSION: No acute fracture or static subluxation of the thoracic spine. Electronically Signed   By: Deatra RobinsonKevin  Herman M.D.   On: 11/13/2017 18:13    Allergies  Allergen Reactions  . Morphine And Related Itching    Past Medical History:  Diagnosis Date  . Migraine   . Migraine with status migrainosus 02/26/2013   Social History   Socioeconomic History  . Marital status: Married    Spouse name: Not on file  . Number of children: Not on file  . Years of education: Not on file  . Highest education level: Not on file  Social Needs  . Financial resource strain: Not on file  . Food insecurity - worry: Not on file  . Food insecurity - inability: Not on file  . Transportation needs - medical: Not on file  . 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 Smoking status: Current Some Day Smoker    Packs/day: 1.00    Types: Cigarettes  . Smokeless tobacco: Never Used  Substance and Sexual Activity  . Alcohol use: No  . Drug use: Yes    Types: Marijuana    Comment: patient smokes when he has headaches  . Sexual activity: Not on file  Other Topics Concern  . Not on  file  Social History Narrative   Live with wife and family has 6 children. Daughter at age 49 with Leukemia, now receiving heart transplant, also had stroke.   Family History  Problem Relation Age of Onset  . Hypertension Mother   . Tuberculosis Mother   . Diabetes Mother    Past Surgical History:  Procedure Laterality Date  . CHOLECYSTECTOMY  2013  . FINGER SURGERY    . ROTATOR CUFF REPAIR Right 2013  . STOMACH SURGERY       Mardella LaymanHagler, Roderica Cathell, MD 11/18/17 [phone]

## 2017-12-13 ENCOUNTER — Other Ambulatory Visit: Payer: Self-pay

## 2017-12-13 ENCOUNTER — Ambulatory Visit (INDEPENDENT_AMBULATORY_CARE_PROVIDER_SITE_OTHER): Payer: BLUE CROSS/BLUE SHIELD

## 2017-12-13 ENCOUNTER — Ambulatory Visit (HOSPITAL_COMMUNITY)
Admission: EM | Admit: 2017-12-13 | Discharge: 2017-12-13 | Disposition: A | Discharge status: Home / Self Care | Payer: BLUE CROSS/BLUE SHIELD | Attending: Internal Medicine | Admitting: Internal Medicine

## 2017-12-13 ENCOUNTER — Encounter (HOSPITAL_COMMUNITY): Payer: Self-pay | Admitting: Emergency Medicine

## 2017-12-13 DIAGNOSIS — R0602 Shortness of breath: Secondary | ICD-10-CM

## 2017-12-13 DIAGNOSIS — J209 Acute bronchitis, unspecified: Secondary | ICD-10-CM | POA: Diagnosis not present

## 2017-12-13 DIAGNOSIS — R05 Cough: Secondary | ICD-10-CM | POA: Diagnosis not present

## 2017-12-13 MED ORDER — PREDNISONE 10 MG (21) PO TBPK: ORAL_TABLET | Freq: Every day | ORAL | 0 refills | Status: DC

## 2017-12-13 MED ORDER — ALBUTEROL SULFATE HFA 108 (90 BASE) MCG/ACT IN AERS: 1.0000 | INHALATION_SPRAY | Freq: Four times a day (QID) | RESPIRATORY_TRACT | 0 refills | Status: DC | PRN

## 2017-12-13 NOTE — ED Triage Notes (Addendum)
Pt reports SOB since yesterday.  He reports a headache today.  He also states he has been dizzy when standing up for several days.  He does report a hx of migraines.  He has had CT Scans for this and several medications tried in the past.

## 2017-12-13 NOTE — ED Provider Notes (Signed)
[facility]    CSN: [id number] Arrival date & time: 12/13/17  1601     History   Chief Complaint Chief Complaint  Patient presents with  . Shortness of Breath  . Headache    HPI [name] is a 52 y.o. male.   [name] presents with complaints of cough, shortness of breath, occasional dizziness, wheezing, chest pain with cough, which started two days ago. Smokes 1 ppd. Without history of asthma. No known fevers. Without runny nose, sore throat or ear pain. Without recent travel, history of blood clots, leg pain or swelling. Has not taken any medications for symptoms. No specific known ill contacts. Denies previous similar illness.   ROS per HPI.       Past Medical History:  Diagnosis Date  . Migraine   . Migraine with status migrainosus 02/26/2013    Patient Active Problem List   Diagnosis Date Noted  . Migraine with status migrainosus 02/26/2013    Past Surgical History:  Procedure Laterality Date  . CHOLECYSTECTOMY  2013  . FINGER SURGERY    . ROTATOR CUFF REPAIR Right 2013  . STOMACH SURGERY         Home Medications    Prior to Admission medications   Medication Sig Start Date End Date Taking? Authorizing Provider  naproxen (NAPROSYN) 500 MG tablet Take 1 tablet (500 mg total) by mouth 2 (two) times daily. 11/13/17  Yes Mardella Layman, MD  traMADol (ULTRAM) 50 MG tablet Take 1 tablet (50 mg total) by mouth every 6 (six) hours as needed. 11/13/17  Yes Hagler, Arlys John, MD  albuterol (PROVENTIL HFA;VENTOLIN HFA) 108 (90 Base) MCG/ACT inhaler Inhale 1-2 puffs into the lungs every 6 (six) hours as needed for wheezing or shortness of breath. 12/13/17   Linus Mako B, NP  lidocaine (LIDODERM) 5 % Place 1 patch onto the skin daily. Remove & Discard patch within 12 hours or as directed by MD 02/26/17   Tilden Fossa, MD  predniSONE (STERAPRED UNI-PAK 21 TAB) 10 MG (21) TBPK tablet Take by mouth daily. Take 6 tabs by mouth daily  for 2 days, then 5  tabs for 2 days, then 4 tabs for 2 days, then 3 tabs for 2 days, 2 tabs for 2 days, then 1 tab by mouth daily for 2 days 12/13/17   Georgetta Haber, NP    Family History Family History  Problem Relation Age of Onset  . Hypertension Mother   . Tuberculosis Mother   . Diabetes Mother     Social History Social History   Tobacco Use  . Smoking status: Current Some Day Smoker    Packs/day: 1.00    Types: Cigarettes  . Smokeless tobacco: Never Used  Substance Use Topics  . Alcohol use: No  . Drug use: Yes    Types: Marijuana    Comment: patient smokes when he has headaches     Allergies   Morphine and related   Review of Systems Review of Systems   Physical Exam Triage Vital Signs ED Triage Vitals  Enc Vitals Group     BP 12/13/17 1621 107/88     Pulse Rate 12/13/17 1621 82     Resp --      Temp 12/13/17 1621 98.6 F (37 C)     Temp Source 12/13/17 1621 Oral     SpO2 12/13/17 1621 99 %     Weight --      Height --  Head Circumference --      Peak Flow --      Pain Score 12/13/17 1622 10     Pain Loc --      Pain Edu? --      Excl. in GC? --    No data found.  Updated Vital Signs BP (!) 126/91 (BP Location: Left Arm)   Pulse 80   Temp 98.6 F (37 C) (Oral)   SpO2 98%   Visual Acuity Right Eye Distance:   Left Eye Distance:   Bilateral Distance:    Right Eye Near:   Left Eye Near:    Bilateral Near:     Physical Exam  Constitutional: He is oriented to person, place, and time. He appears well-developed and well-nourished.  HENT:  Head: Normocephalic and atraumatic.  Right Ear: Tympanic membrane, external ear and ear canal normal.  Left Ear: Tympanic membrane, external ear and ear canal normal.  Nose: Nose normal. Right sinus exhibits no maxillary sinus tenderness and no frontal sinus tenderness. Left sinus exhibits no maxillary sinus tenderness and no frontal sinus tenderness.  Mouth/Throat: Uvula is midline, oropharynx is clear and moist and  mucous membranes are normal.  Eyes: Conjunctivae are normal. Pupils are equal, round, and reactive to light.  Neck: Normal range of motion.  Cardiovascular: Normal rate and regular rhythm.  Pulmonary/Chest: Effort normal. He has wheezes.  Scattered expiratory wheezes  Lymphadenopathy:    He has no cervical adenopathy.  Neurological: He is alert and oriented to person, place, and time.  Skin: Skin is warm and dry.  Vitals reviewed.    UC Treatments / Results  Labs (all labs ordered are listed, but only abnormal results are displayed) Labs Reviewed - No data to display  EKG  EKG Interpretation None       Radiology Dg Chest 2 View  Result Date: 12/13/2017 CLINICAL DATA:  Cough, shortness of breath, wheezing and dizziness for the past 3 days. Smoker. EXAM: CHEST  2 VIEW COMPARISON:  02/26/2017 and thoracic spine radiographs dated 11/13/2017. FINDINGS: Normal sized heart. Clear lungs. The lungs are mildly hyperexpanded with stable mild diffuse peribronchial thickening and accentuation of the interstitial markings. Lower thoracic spine degenerative changes. IMPRESSION: No acute abnormality. Stable mild changes of COPD and chronic bronchitis. Electronically Signed   By: Beckie SaltsSteven  Reid M.D.   On: 12/13/2017 17:31    Procedures Procedures (including critical care time)  Medications Ordered in UC Medications - No data to display   Initial Impression / Assessment and Plan / UC Course  I have reviewed the triage vital signs and the nursing notes.  Pertinent labs & imaging results that were available during my care of the patient were reviewed by me and considered in my medical decision making (see chart for details).    History, physical and xray consistent with acute bronchitis. Without tachypnea, tachycardia or hypoxia. Non toxic in appearance. Course of prednisone, inhaler for prn use, recommended to quit smoking. Push fluids. Return precautions provided. Patient verbalized  understanding and agreeable to plan.  Ambulatory out of clinic without difficulty.    Final Clinical Impressions(s) / UC Diagnoses   Final diagnoses:  Acute bronchitis, unspecified organism    ED Discharge Orders        Ordered    predniSONE (STERAPRED UNI-PAK 21 TAB) 10 MG (21) TBPK tablet  Daily     12/13/17 1747    albuterol (PROVENTIL HFA;VENTOLIN HFA) 108 (90 Base) MCG/ACT inhaler  Every 6 hours PRN  12/13/17 1747       Controlled Substance Prescriptions [place] Controlled Substance Registry consulted? Not Applicable   Georgetta Haber, NP 12/13/17 1754

## 2017-12-13 NOTE — Discharge Instructions (Signed)
Push fluids to ensure adequate hydration and keep secretions thin.  Tylenol and/or ibuprofen as needed for pain or fevers. Steroid dose pack as prescribed.  Albuterol inhaler as needed for wheezing.  If symptoms worsen, develop increased shortness breath, chest pain, dizziness, or do not improve in the next week to return to be seen or to follow up with PCP.

## 2018-01-15 ENCOUNTER — Other Ambulatory Visit: Payer: Self-pay

## 2018-01-15 ENCOUNTER — Encounter (HOSPITAL_COMMUNITY): Payer: Self-pay | Admitting: Emergency Medicine

## 2018-01-15 ENCOUNTER — Ambulatory Visit (HOSPITAL_COMMUNITY)
Admission: EM | Admit: 2018-01-15 | Discharge: 2018-01-15 | Disposition: A | Discharge status: Home / Self Care | Payer: BLUE CROSS/BLUE SHIELD | Attending: Family Medicine | Admitting: Family Medicine

## 2018-01-15 DIAGNOSIS — R110 Nausea: Secondary | ICD-10-CM | POA: Diagnosis not present

## 2018-01-15 DIAGNOSIS — G43909 Migraine, unspecified, not intractable, without status migrainosus: Secondary | ICD-10-CM

## 2018-01-15 MED ORDER — DEXAMETHASONE SODIUM PHOSPHATE 10 MG/ML IJ SOLN
INTRAMUSCULAR | Status: AC
  Filled 2018-01-15: qty 1

## 2018-01-15 MED ORDER — DEXAMETHASONE SODIUM PHOSPHATE 10 MG/ML IJ SOLN
10.0000 mg | Freq: Once | INTRAMUSCULAR | Status: AC
  Administered 2018-01-15: 10 mg via INTRAMUSCULAR

## 2018-01-15 MED ORDER — KETOROLAC TROMETHAMINE 60 MG/2ML IM SOLN
INTRAMUSCULAR | Status: AC
  Filled 2018-01-15: qty 2

## 2018-01-15 MED ORDER — SUMATRIPTAN SUCCINATE 6 MG/0.5ML ~~LOC~~ SOLN
6.0000 mg | Freq: Once | SUBCUTANEOUS | Status: AC
  Administered 2018-01-15: 6 mg via SUBCUTANEOUS

## 2018-01-15 MED ORDER — METOCLOPRAMIDE HCL 5 MG/ML IJ SOLN
INTRAMUSCULAR | Status: AC
  Filled 2018-01-15: qty 2

## 2018-01-15 MED ORDER — KETOROLAC TROMETHAMINE 60 MG/2ML IM SOLN
60.0000 mg | Freq: Once | INTRAMUSCULAR | Status: AC
  Administered 2018-01-15: 60 mg via INTRAMUSCULAR

## 2018-01-15 MED ORDER — SUMATRIPTAN SUCCINATE 6 MG/0.5ML ~~LOC~~ SOLN
SUBCUTANEOUS | Status: AC
  Filled 2018-01-15: qty 0.5

## 2018-01-15 MED ORDER — METOCLOPRAMIDE HCL 5 MG/ML IJ SOLN
5.0000 mg | Freq: Once | INTRAMUSCULAR | Status: AC
  Administered 2018-01-15: 5 mg via INTRAMUSCULAR

## 2018-01-15 NOTE — Discharge Instructions (Signed)
Please follow up here if you are not improving by tomorrow morning.

## 2018-01-15 NOTE — ED Triage Notes (Signed)
Pt reports a migraine that started yesterday.  He states this is a recurrent issue, but has no medication at home for this.  He reports he has to be hospitalized sometimes for his headaches.

## 2018-01-17 ENCOUNTER — Ambulatory Visit (HOSPITAL_COMMUNITY)
Admission: EM | Admit: 2018-01-17 | Discharge: 2018-01-17 | Disposition: A | Discharge status: Home / Self Care | Payer: BLUE CROSS/BLUE SHIELD | Attending: Family Medicine | Admitting: Family Medicine

## 2018-01-17 ENCOUNTER — Encounter (HOSPITAL_COMMUNITY): Payer: Self-pay | Admitting: Emergency Medicine

## 2018-01-17 DIAGNOSIS — R110 Nausea: Secondary | ICD-10-CM

## 2018-01-17 DIAGNOSIS — G43909 Migraine, unspecified, not intractable, without status migrainosus: Secondary | ICD-10-CM

## 2018-01-17 MED ORDER — DEXAMETHASONE SODIUM PHOSPHATE 10 MG/ML IJ SOLN
INTRAMUSCULAR | Status: AC
  Filled 2018-01-17: qty 1

## 2018-01-17 MED ORDER — ONDANSETRON 4 MG PO TBDP
ORAL_TABLET | ORAL | Status: AC
  Filled 2018-01-17: qty 1

## 2018-01-17 MED ORDER — ONDANSETRON 4 MG PO TBDP
4.0000 mg | ORAL_TABLET | Freq: Once | ORAL | Status: AC
  Administered 2018-01-17: 4 mg via ORAL

## 2018-01-17 MED ORDER — KETOROLAC TROMETHAMINE 60 MG/2ML IM SOLN
60.0000 mg | Freq: Once | INTRAMUSCULAR | Status: AC
  Administered 2018-01-17: 60 mg via INTRAMUSCULAR

## 2018-01-17 MED ORDER — KETOROLAC TROMETHAMINE 60 MG/2ML IM SOLN
INTRAMUSCULAR | Status: AC
  Filled 2018-01-17: qty 2

## 2018-01-17 MED ORDER — DEXAMETHASONE SODIUM PHOSPHATE 10 MG/ML IJ SOLN
10.0000 mg | Freq: Once | INTRAMUSCULAR | Status: AC
  Administered 2018-01-17: 10 mg via INTRAMUSCULAR

## 2018-01-17 MED ORDER — IBUPROFEN 600 MG PO TABS: 600.0000 mg | ORAL_TABLET | Freq: Four times a day (QID) | ORAL | 0 refills | Status: DC | PRN

## 2018-01-17 MED ORDER — DIPHENHYDRAMINE HCL 25 MG PO TABS: 25.0000 mg | ORAL_TABLET | Freq: Four times a day (QID) | ORAL | 0 refills | Status: DC

## 2018-01-17 NOTE — ED Triage Notes (Signed)
PT C/O: constant migraine headache onset 4 days associated w/body aches  Reports he was seen on 2/13 here for similar sx   A&O x4... NAD... Ambulatory

## 2018-01-17 NOTE — Discharge Instructions (Signed)
Increase fluid intake. May take a benadryl tonight as well to help promote sleep. Ibuprofen as needed for pain, take with food. If symptoms worsen or do not improve in the next week to return to be seen or to follow up with a primary care provider

## 2018-01-17 NOTE — ED Provider Notes (Signed)
[facility]    CSN: [id number] Arrival date & time: 01/17/18  1727     History   Chief Complaint Chief Complaint  Patient presents with  . Headache    HPI [name] is a 52 y.o. male.   [name] presents with complaints of migraine headache. He states he has a history of migraines and this feels the same. States he has had worse than this episode even. States has had CT's in the past as well as followed with neurology. Has not seen neurology in 2 years. Pain is 8/10, is throbbing and tight. Worse with light and movement. Anxiety makes it worse. Does not take any medications for symptoms. Was seen here two days ago and given headache cocktail which helped but did not completely resolve symptoms. Nausea without vomiting. Body aches. Does not follow with a PCP. Per chart review had head MRI in 2014 for 2 weeks history of headache, negative for acute findings.    ROS per HPI.       Past Medical History:  Diagnosis Date  . Migraine   . Migraine with status migrainosus 02/26/2013    Patient Active Problem List   Diagnosis Date Noted  . Migraine with status migrainosus 02/26/2013    Past Surgical History:  Procedure Laterality Date  . CHOLECYSTECTOMY  2013  . FINGER SURGERY    . ROTATOR CUFF REPAIR Right 2013  . STOMACH SURGERY         Home Medications    Prior to Admission medications   Medication Sig Start Date End Date Taking? Authorizing Provider  albuterol (PROVENTIL HFA;VENTOLIN HFA) 108 (90 Base) MCG/ACT inhaler Inhale 1-2 puffs into the lungs every 6 (six) hours as needed for wheezing or shortness of breath. 12/13/17  Yes Linus Mako B, NP  diphenhydrAMINE (BENADRYL) 25 MG tablet Take 1 tablet (25 mg total) by mouth every 6 (six) hours. 01/17/18   Georgetta Haber, NP  ibuprofen (ADVIL,MOTRIN) 600 MG tablet Take 1 tablet (600 mg total) by mouth every 6 (six) hours as needed. 01/17/18   Linus Mako B, NP  lidocaine (LIDODERM) 5 % Place 1  patch onto the skin daily. Remove & Discard patch within 12 hours or as directed by MD 02/26/17   Tilden Fossa, MD  naproxen (NAPROSYN) 500 MG tablet Take 1 tablet (500 mg total) by mouth 2 (two) times daily. 11/13/17   Mardella Layman, MD    Family History Family History  Problem Relation Age of Onset  . Hypertension Mother   . Tuberculosis Mother   . Diabetes Mother     Social History Social History   Tobacco Use  . Smoking status: Current Some Day Smoker    Packs/day: 1.00    Types: Cigarettes  . Smokeless tobacco: Never Used  Substance Use Topics  . Alcohol use: No  . Drug use: Yes    Types: Marijuana    Comment: patient smokes when he has headaches     Allergies   Patient has no known allergies.   Review of Systems Review of Systems   Physical Exam Triage Vital Signs ED Triage Vitals  Enc Vitals Group     BP 01/17/18 1751 113/71     Pulse Rate 01/17/18 1751 66     Resp 01/17/18 1751 16     Temp 01/17/18 1751 99 F (37.2 C)     Temp Source 01/17/18 1751 Oral     SpO2 01/17/18 1751 99 %  Weight --      Height --      Head Circumference --      Peak Flow --      Pain Score 01/17/18 1752 8     Pain Loc --      Pain Edu? --      Excl. in GC? --    No data found.  Updated Vital Signs BP 113/71 (BP Location: Left Arm)   Pulse 66   Temp 99 F (37.2 C) (Oral)   Resp 16   SpO2 99%   Visual Acuity Right Eye Distance:   Left Eye Distance:   Bilateral Distance:    Right Eye Near:   Left Eye Near:    Bilateral Near:     Physical Exam  Constitutional: He is oriented to person, place, and time. He appears well-developed and well-nourished.  HENT:  Head: Normocephalic and atraumatic.  Eyes: EOM are normal.  Cardiovascular: Normal rate and regular rhythm.  Pulmonary/Chest: Effort normal and breath sounds normal.  Neurological: He is alert and oriented to person, place, and time. He has normal strength. He is not disoriented. No cranial nerve  deficit or sensory deficit. He displays a negative Romberg sign. GCS eye subscore is 4. GCS verbal subscore is 5. GCS motor subscore is 6.  Skin: Skin is warm and dry.     UC Treatments / Results  Labs (all labs ordered are listed, but only abnormal results are displayed) Labs Reviewed - No data to display  EKG  EKG Interpretation None       Radiology No results found.  Procedures Procedures (including critical care time)  Medications Ordered in UC Medications  ketorolac (TORADOL) injection 60 mg (not administered)  dexamethasone (DECADRON) injection 10 mg (not administered)  ondansetron (ZOFRAN-ODT) disintegrating tablet 4 mg (not administered)     Initial Impression / Assessment and Plan / UC Course  I have reviewed the triage vital signs and the nursing notes.  Pertinent labs & imaging results that were available during my care of the patient were reviewed by me and considered in my medical decision making (see chart for details).     Without neurological findings on exam. Patient has not taken any medications for symptoms. Toradol, decadron and zofran provided in clinic. Recommended use of benadryl tonight to promote rest. Ibuprofen as needed for pain. Encouraged establish with PCP for management. Return precautions provided. Patient verbalized understanding and agreeable to plan.  Ambulatory out of clinic without difficulty.    Final Clinical Impressions(s) / UC Diagnoses   Final diagnoses:  Migraine without status migrainosus, not intractable, unspecified migraine type    ED Discharge Orders        Ordered    ibuprofen (ADVIL,MOTRIN) 600 MG tablet  Every 6 hours PRN     01/17/18 1830    diphenhydrAMINE (BENADRYL) 25 MG tablet  Every 6 hours     01/17/18 1830       Controlled Substance Prescriptions [place] Controlled Substance Registry consulted? Not Applicable   Georgetta HaberBurky, Jomel Whittlesey B, NP 01/17/18 1836

## 2018-01-22 NOTE — ED Provider Notes (Signed)
  [facility]   [id number] 01/15/18 Arrival Time: 1336  ASSESSMENT & PLAN:  1. Migraine without status migrainosus, not intractable, unspecified migraine type     Meds ordered this encounter  Medications  . ketorolac (TORADOL) injection 60 mg  . metoCLOPramide (REGLAN) injection 5 mg  . dexamethasone (DECADRON) injection 10 mg  . SUMAtriptan (IMITREX) injection 6 mg   Will f/u here tomorrow morning if not improving overnight. ED if abrupt worsening. Discussed. Reviewed expectations re: course of current medical issues. Questions answered. Outlined signs and symptoms indicating need for more acute intervention. Patient verbalized understanding. After Visit Summary given.   SUBJECTIVE:  [name] is a 52 y.o. male who presents with complaint of a migraine headache. Fairly abrupt onset yesterday. Typical for his usu migraine symptoms. Frontal today. Throbbing. Light sensitivity. Mild nausea without vomiting. Overall fatigued. No trigger identified. OTC analgesics without much help. Ambulatory without difficulty. No extremity sensation changes or weakness. Has been hospitalized in past for migraines.  ROS: As per HPI.   OBJECTIVE:  Vitals:   01/15/18 1437  BP: 126/79  Pulse: 68  Temp: 98.7 F (37.1 C)  TempSrc: Oral  SpO2: 100%    General appearance: alert; no distress but appears fatigued Eyes: PERRLA; EOMI; conjunctiva normal HENT: normocephalic; atraumatic Neck: supple with FROM Lungs: clear to auscultation bilaterally Heart: regular rate and rhythm Extremities: no edema; symmetrical with no gross deformities Skin: warm and dry Neurologic: CN 2-12 grossly intact; normal gait; normal symmetric reflexes; normal extremity strength and sensation throughout Psychological: alert and cooperative; normal mood and affect  No Known Allergies  Past Medical History:  Diagnosis Date  . Migraine   . Migraine with status migrainosus 02/26/2013   Social  History   Socioeconomic History  . Marital status: Married    Spouse name: Not on file  . Number of children: Not on file  . Years of education: Not on file  . Highest education level: Not on file  Social Needs  . Financial resource strain: Not on file  . Food insecurity - worry: Not on file  . Food insecurity - inability: Not on file  . Transportation needs - medical: Not on file  . 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 Smoking status: Current Some Day Smoker    Packs/day: 1.00    Types: Cigarettes  . Smokeless tobacco: Never Used  Substance and Sexual Activity  . Alcohol use: No  . Drug use: Yes    Types: Marijuana    Comment: patient smokes when he has headaches  . Sexual activity: Not on file  Other Topics Concern  . Not on file  Social History Narrative   Live with wife and family has 6 children. Daughter at age 214 with Leukemia, now receiving heart transplant, also had stroke.   Family History  Problem Relation Age of Onset  . Hypertension Mother   . Tuberculosis Mother   . Diabetes Mother    Past Surgical History:  Procedure Laterality Date  . CHOLECYSTECTOMY  2013  . FINGER SURGERY    . ROTATOR CUFF REPAIR Right 2013  . STOMACH SURGERY       Mardella LaymanHagler, Gudelia Eugene, MD 01/22/18 [phone]

## 2018-03-04 ENCOUNTER — Encounter (HOSPITAL_COMMUNITY): Payer: Self-pay | Admitting: Emergency Medicine

## 2018-03-04 ENCOUNTER — Other Ambulatory Visit: Payer: Self-pay

## 2018-03-04 ENCOUNTER — Ambulatory Visit (HOSPITAL_COMMUNITY)
Admission: EM | Admit: 2018-03-04 | Discharge: 2018-03-04 | Disposition: A | Discharge status: Home / Self Care | Payer: BLUE CROSS/BLUE SHIELD | Attending: Family Medicine | Admitting: Family Medicine

## 2018-03-04 DIAGNOSIS — M25562 Pain in left knee: Secondary | ICD-10-CM | POA: Diagnosis not present

## 2018-03-04 MED ORDER — MELOXICAM 15 MG PO TABS: 15.0000 mg | ORAL_TABLET | Freq: Every day | ORAL | 0 refills | Status: DC

## 2018-03-04 NOTE — Discharge Instructions (Signed)
Start Mobic as directed. Ice compress, elevation, knee sleeve during activity. Given history of injury, follow up with PCP/orthopedics for further evaluation.

## 2018-03-04 NOTE — ED Provider Notes (Signed)
[facility]    CSN: [id number] Arrival date & time: 03/04/18  1751     History   Chief Complaint Chief Complaint  Patient presents with  . Knee Pain    HPI [name] is a 52 y.o. male.   52 year old male comes in for 1 month history of left knee pain with swelling.  Denies recent injury/trauma.  States he has old injury to both knees from accident many years ago.  Work requires strenuous physical activity, and frequent climbing of the stairs.  States that he has noticed increased swelling to the left knee with painful movement and weightbearing.  Denies fever, chills, night sweats.  Denies erythema, increased warmth.  Has not taken anything for the symptoms.     Past Medical History:  Diagnosis Date  . Migraine   . Migraine with status migrainosus 02/26/2013    Patient Active Problem List   Diagnosis Date Noted  . Migraine with status migrainosus 02/26/2013    Past Surgical History:  Procedure Laterality Date  . CHOLECYSTECTOMY  2013  . FINGER SURGERY    . ROTATOR CUFF REPAIR Right 2013  . STOMACH SURGERY         Home Medications    Prior to Admission medications   Medication Sig Start Date End Date Taking? Authorizing Provider  albuterol (PROVENTIL HFA;VENTOLIN HFA) 108 (90 Base) MCG/ACT inhaler Inhale 1-2 puffs into the lungs every 6 (six) hours as needed for wheezing or shortness of breath. 12/13/17   Georgetta Haber, NP  diphenhydrAMINE (BENADRYL) 25 MG tablet Take 1 tablet (25 mg total) by mouth every 6 (six) hours. 01/17/18   Georgetta Haber, NP  ibuprofen (ADVIL,MOTRIN) 600 MG tablet Take 1 tablet (600 mg total) by mouth every 6 (six) hours as needed. 01/17/18   Linus Mako B, NP  lidocaine (LIDODERM) 5 % Place 1 patch onto the skin daily. Remove & Discard patch within 12 hours or as directed by MD 02/26/17   Tilden Fossa, MD  meloxicam (MOBIC) 15 MG tablet Take 1 tablet (15 mg total) by mouth daily. 03/04/18   Belinda Fisher, PA-C     Family History Family History  Problem Relation Age of Onset  . Hypertension Mother   . Tuberculosis Mother   . Diabetes Mother     Social History Social History   Tobacco Use  . Smoking status: Current Some Day Smoker    Packs/day: 1.00    Types: Cigarettes  . Smokeless tobacco: Never Used  Substance Use Topics  . Alcohol use: No  . Drug use: Yes    Types: Marijuana    Comment: patient smokes when he has headaches     Allergies   Patient has no known allergies.   Review of Systems Review of Systems  Reason unable to perform ROS: See HPI as above.     Physical Exam Triage Vital Signs ED Triage Vitals  Enc Vitals Group     BP 03/04/18 1812 (!) 148/87     Pulse Rate 03/04/18 1812 71     Resp --      Temp 03/04/18 1812 98.2 F (36.8 C)     Temp Source 03/04/18 1812 Oral     SpO2 03/04/18 1812 100 %     Weight --      Height --      Head Circumference --      Peak Flow --      Pain Score 03/04/18 1810 8  Pain Loc --      Pain Edu? --      Excl. in GC? --    No data found.  Updated Vital Signs BP (!) 148/87 (BP Location: Left Arm)   Pulse 71   Temp 98.2 F (36.8 C) (Oral)   SpO2 100%   Physical Exam  Constitutional: He is oriented to person, place, and time. He appears well-developed and well-nourished. No distress.  HENT:  Head: Normocephalic and atraumatic.  Eyes: Pupils are equal, round, and reactive to light. Conjunctivae are normal.  Musculoskeletal:  Mild swelling to the upper medial aspect of the left knee. No erythema, increased warmth, contusion. Tenderness to palpation of suprapatellar region, as well as bilateral joint line. Full ROM. Strength normal and equal bilaterally. Sensation intact and equal bilaterally.   Neurological: He is alert and oriented to person, place, and time.     UC Treatments / Results  Labs (all labs ordered are listed, but only abnormal results are displayed) Labs Reviewed - No data to  display  EKG None Radiology No results found.  Procedures Procedures (including critical care time)  Medications Ordered in UC Medications - No data to display   Initial Impression / Assessment and Plan / UC Course  I have reviewed the triage vital signs and the nursing notes.  Pertinent labs & imaging results that were available during my care of the patient were reviewed by me and considered in my medical decision making (see chart for details).    52 year old male with 1 month history of atraumatic left knee pain and swelling.  Will start Mobic, ice compress, elevation, knee sleeve during activity.  Given history of injury to the knee, follow-up with PCP/orthopedics for further evaluation and treatment needed.  Patient expresses understanding and agrees to plan.  Final Clinical Impressions(s) / UC Diagnoses   Final diagnoses:  Acute pain of left knee    ED Discharge Orders        Ordered    meloxicam (MOBIC) 15 MG tablet  Daily     03/04/18 1829       Belinda FisherYu, Jhamal Plucinski V, PA-C 03/04/18 1836

## 2018-03-04 NOTE — ED Triage Notes (Signed)
C/o left knee pain onset one month that has become worse and having swelling, no known injury

## 2018-06-24 ENCOUNTER — Other Ambulatory Visit: Payer: Self-pay

## 2018-06-24 ENCOUNTER — Emergency Department (HOSPITAL_COMMUNITY)
Admission: EM | Admit: 2018-06-24 | Discharge: 2018-06-24 | Disposition: A | Discharge status: Home / Self Care | Payer: BLUE CROSS/BLUE SHIELD | Attending: Emergency Medicine | Admitting: Emergency Medicine

## 2018-06-24 ENCOUNTER — Encounter (HOSPITAL_COMMUNITY): Payer: Self-pay | Admitting: Emergency Medicine

## 2018-06-24 DIAGNOSIS — G43809 Other migraine, not intractable, without status migrainosus: Secondary | ICD-10-CM | POA: Insufficient documentation

## 2018-06-24 DIAGNOSIS — F17210 Nicotine dependence, cigarettes, uncomplicated: Secondary | ICD-10-CM | POA: Insufficient documentation

## 2018-06-24 DIAGNOSIS — R51 Headache: Secondary | ICD-10-CM | POA: Diagnosis present

## 2018-06-24 MED ORDER — ONDANSETRON HCL 4 MG/2ML IJ SOLN
4.0000 mg | Freq: Once | INTRAMUSCULAR | Status: AC
  Administered 2018-06-24: 4 mg via INTRAVENOUS
  Filled 2018-06-24: qty 2

## 2018-06-24 MED ORDER — ACETAMINOPHEN 500 MG PO TABS
1000.0000 mg | ORAL_TABLET | Freq: Once | ORAL | Status: AC
  Administered 2018-06-24: 1000 mg via ORAL
  Filled 2018-06-24: qty 2

## 2018-06-24 MED ORDER — SODIUM CHLORIDE 0.9 % IV BOLUS
1000.0000 mL | Freq: Once | INTRAVENOUS | Status: AC
  Administered 2018-06-24: 1000 mL via INTRAVENOUS

## 2018-06-24 MED ORDER — PROCHLORPERAZINE EDISYLATE 10 MG/2ML IJ SOLN
10.0000 mg | Freq: Once | INTRAMUSCULAR | Status: AC
Start: 2018-06-24 — End: 2018-06-24
  Administered 2018-06-24: 10 mg via INTRAVENOUS
  Filled 2018-06-24: qty 2

## 2018-06-24 MED ORDER — KETOROLAC TROMETHAMINE 30 MG/ML IJ SOLN
30.0000 mg | Freq: Once | INTRAMUSCULAR | Status: AC
  Administered 2018-06-24: 30 mg via INTRAVENOUS
  Filled 2018-06-24: qty 1

## 2018-06-24 NOTE — ED Triage Notes (Addendum)
Pt has hx of migraines that usually require emergency department iv medications.  Began yesterday. Blurred vision but this is a regular symptom when he has migraines.

## 2018-06-24 NOTE — ED Provider Notes (Signed)
[facility]   CSN: [id number] Arrival date & time: 06/24/18  0211     History   Chief Complaint Chief Complaint  Patient presents with  . Migraine    HPI Otelia LimesCharles T Delacruz is a 52 y.o. male.  HPI Patient is a 52 year old male presents the emergency department with a history of migraine headaches and reports recurrent migraine which began yesterday.  No fevers or chills.  No neck pain or neck stiffness.  Pain is moderate in severity.  Worse with light and loud noises.  Feels like his prior migraines.  Tried ibuprofen without improvement in symptoms.  No recent head injury or trauma.  No weakness of his arms or legs.  No abdominal pain or chest pain.  No shortness of breath.  Denies back pain.    Past Medical History:  Diagnosis Date  . Migraine   . Migraine with status migrainosus 02/26/2013    Patient Active Problem List   Diagnosis Date Noted  . Migraine with status migrainosus 02/26/2013    Past Surgical History:  Procedure Laterality Date  . CHOLECYSTECTOMY  2013  . FINGER SURGERY    . ROTATOR CUFF REPAIR Right 2013  . STOMACH SURGERY          Home Medications    Prior to Admission medications   Medication Sig Start Date End Date Taking? Authorizing Provider  albuterol (PROVENTIL HFA;VENTOLIN HFA) 108 (90 Base) MCG/ACT inhaler Inhale 1-2 puffs into the lungs every 6 (six) hours as needed for wheezing or shortness of breath. 12/13/17  Yes Burky, Dorene GrebeNatalie B, NP  ibuprofen (ADVIL,MOTRIN) 600 MG tablet Take 1 tablet (600 mg total) by mouth every 6 (six) hours as needed. Patient taking differently: Take 600 mg by mouth every 6 (six) hours as needed for mild pain.  01/17/18  Yes Linus MakoBurky, Natalie B, NP  diphenhydrAMINE (BENADRYL) 25 MG tablet Take 1 tablet (25 mg total) by mouth every 6 (six) hours. Patient not taking: Reported on 06/24/2018 01/17/18   Linus MakoBurky, Natalie B, NP  lidocaine (LIDODERM) 5 % Place 1 patch onto the  skin daily. Remove & Discard patch within 12 hours or as directed by MD Patient not taking: Reported on 06/24/2018 02/26/17   Tilden Fossaees, Elizabeth, MD  meloxicam (MOBIC) 15 MG tablet Take 1 tablet (15 mg total) by mouth daily. Patient not taking: Reported on 06/24/2018 03/04/18   Lurline IdolYu, Amy V, PA-C    Family History Family History  Problem Relation Age of Onset  . Hypertension Mother   . Tuberculosis Mother   . Diabetes Mother     Social History Social History   Tobacco Use  . Smoking status: Current Some Day Smoker    Packs/day: 1.00    Types: Cigarettes  . Smokeless tobacco: Never Used  Substance Use Topics  . Alcohol use: No  . Drug use: Yes    Types: Marijuana    Comment: patient smokes when he has headaches     Allergies   Patient has no known allergies.   Review of Systems Review of Systems  All other systems reviewed and are negative.    Physical Exam Updated Vital Signs BP 135/80   Pulse 60   Temp 99.2 F (37.3 C) (Oral)   Resp 17   Ht 5\' 9"  (1.753 m)   Wt 90.7 kg (200 lb)   SpO2 91%   BMI 29.53 kg/m   Physical Exam  Constitutional: He is oriented to person, place, and  time. He appears well-developed and well-nourished.  HENT:  Head: Normocephalic and atraumatic.  Eyes: Pupils are equal, round, and reactive to light. EOM are normal.  Neck: Normal range of motion.  Cardiovascular: Normal rate, regular rhythm, normal heart sounds and intact distal pulses.  Pulmonary/Chest: Effort normal and breath sounds normal. No respiratory distress.  Abdominal: Soft. He exhibits no distension. There is no tenderness.  Musculoskeletal: Normal range of motion.  Neurological: He is alert and oriented to person, place, and time.  5/5 strength in major muscle groups of  bilateral upper and lower extremities. Speech normal. No facial asymetry.   Skin: Skin is warm and dry.  Psychiatric: He has a normal mood and affect. Judgment normal.  Nursing note and vitals  reviewed.    ED Treatments / Results  Labs (all labs ordered are listed, but only abnormal results are displayed) Labs Reviewed - No data to display  EKG None  Radiology No results found.  Procedures Procedures (including critical care time)  Medications Ordered in ED Medications  sodium chloride 0.9 % bolus 1,000 mL (has no administration in time range)  prochlorperazine (COMPAZINE) injection 10 mg (has no administration in time range)  ketorolac (TORADOL) 30 MG/ML injection 30 mg (has no administration in time range)  ondansetron (ZOFRAN) injection 4 mg (has no administration in time range)     Initial Impression / Assessment and Plan / ED Course  I have reviewed the triage vital signs and the nursing notes.  Pertinent labs & imaging results that were available during my care of the patient were reviewed by me and considered in my medical decision making (see chart for details).     Patient be treated for migraine headache at this time.  Normal neurologic exam.  No indication for advanced imaging or lab work-up.  Will symptomatically try to manage here in the ER.  Patient likely will ultimately require outpatient neurology follow-up given history of recurrent headaches since age 12.  Primary care follow-up as well.  I will reevaluate the patient after he is treated here in the ER  Final Clinical Impressions(s) / ED Diagnoses   Final diagnoses:  None    ED Discharge Orders    None       Azalia Bilis, MD 06/24/18 [phone]

## 2018-06-26 NOTE — ED Notes (Signed)
Discharge callback completed to follow up on visit 1639 06/26/2018

## 2018-10-26 ENCOUNTER — Encounter (HOSPITAL_COMMUNITY): Payer: Self-pay | Admitting: *Deleted

## 2018-10-26 ENCOUNTER — Ambulatory Visit (HOSPITAL_COMMUNITY)
Admission: EM | Admit: 2018-10-26 | Discharge: 2018-10-26 | Disposition: A | Discharge status: Home / Self Care | Payer: BLUE CROSS/BLUE SHIELD | Attending: Family Medicine | Admitting: Family Medicine

## 2018-10-26 ENCOUNTER — Other Ambulatory Visit: Payer: Self-pay

## 2018-10-26 DIAGNOSIS — R05 Cough: Secondary | ICD-10-CM

## 2018-10-26 DIAGNOSIS — R062 Wheezing: Secondary | ICD-10-CM

## 2018-10-26 DIAGNOSIS — R0981 Nasal congestion: Secondary | ICD-10-CM

## 2018-10-26 DIAGNOSIS — J069 Acute upper respiratory infection, unspecified: Secondary | ICD-10-CM | POA: Diagnosis not present

## 2018-10-26 DIAGNOSIS — F17210 Nicotine dependence, cigarettes, uncomplicated: Secondary | ICD-10-CM

## 2018-10-26 HISTORY — DX: Pneumonia, unspecified organism: J18.9

## 2018-10-26 MED ORDER — IPRATROPIUM-ALBUTEROL 0.5-2.5 (3) MG/3ML IN SOLN
RESPIRATORY_TRACT | Status: AC
  Filled 2018-10-26: qty 3

## 2018-10-26 MED ORDER — ALBUTEROL SULFATE HFA 108 (90 BASE) MCG/ACT IN AERS: 1.0000 | INHALATION_SPRAY | Freq: Four times a day (QID) | RESPIRATORY_TRACT | 0 refills | Status: DC | PRN

## 2018-10-26 MED ORDER — PREDNISONE 20 MG PO TABS: 20.0000 mg | ORAL_TABLET | Freq: Two times a day (BID) | ORAL | 0 refills | Status: DC

## 2018-10-26 MED ORDER — AZITHROMYCIN 250 MG PO TABS: ORAL_TABLET | ORAL | 0 refills | Status: DC

## 2018-10-26 MED ORDER — IPRATROPIUM-ALBUTEROL 0.5-2.5 (3) MG/3ML IN SOLN
3.0000 mL | Freq: Once | RESPIRATORY_TRACT | Status: AC
  Administered 2018-10-26: 3 mL via RESPIRATORY_TRACT

## 2018-10-26 NOTE — Discharge Instructions (Addendum)
Push fluids Rest Try Mucinex DM for the cough and congestion Take prednisone 2 times a day Take antibiotics 2 times a day Use the inhaler every 4 hours as needed for wheezing Return here or to your PCP if not improving in a few days

## 2018-10-26 NOTE — ED Triage Notes (Signed)
C/O SOB with wheezing, slight cough x 2 days without fever.  C/O chest pain with cough and deep breathing.

## 2018-10-26 NOTE — ED Provider Notes (Signed)
[facility]    CSN: [id number] Arrival date & time: 10/26/18  1740     History   Chief Complaint Chief Complaint  Patient presents with  . Wheezing  . Shortness of Breath    HPI [name] is a 52 y.o. male.   HPI  Patient has known wheezing from years of cigarette smoking.  He states that he frequently gets wheezing when he has an upper respiratory infection.  He has had a cough and cold this been going on for several days.  No fever.  Runny stuffy nose.  Had pressure and pain.  No ear pressure pain.  No sore throat.  Over this weekend he has had increasing chest congestion.  Cough.  Productive cough.  Chest pain with deep breath.  Fatigue.  He has felt like he might have a temperature.  He does have a history of pneumonia.  He states he is worried about getting pneumonia again.  Past Medical History:  Diagnosis Date  . Migraine   . Migraine with status migrainosus 02/26/2013  . Pneumonia     Patient Active Problem List   Diagnosis Date Noted  . Migraine with status migrainosus 02/26/2013    Past Surgical History:  Procedure Laterality Date  . CHOLECYSTECTOMY  2013  . FINGER SURGERY    . ROTATOR CUFF REPAIR Right 2013  . STOMACH SURGERY         Home Medications    Prior to Admission medications   Medication Sig Start Date End Date Taking? Authorizing Provider  albuterol (PROVENTIL HFA;VENTOLIN HFA) 108 (90 Base) MCG/ACT inhaler Inhale 1-2 puffs into the lungs every 6 (six) hours as needed for wheezing or shortness of breath. 10/26/18   Eustace Moore, MD  azithromycin (ZITHROMAX Z-PAK) 250 MG tablet Take two pills today followed by one a day until gone 10/26/18   Eustace Moore, MD  predniSONE (DELTASONE) 20 MG tablet Take 1 tablet (20 mg total) by mouth 2 (two) times daily with a meal. 10/26/18   Eustace Moore, MD    Family History Family History  Problem Relation Age of Onset  . Hypertension Mother   . Tuberculosis Mother     . Diabetes Mother     Social History Social History   Tobacco Use  . Smoking status: Current Some Day Smoker    Packs/day: 1.00    Types: Cigarettes  . Smokeless tobacco: Never Used  Substance Use Topics  . Alcohol use: No  . Drug use: Yes    Types: Marijuana     Allergies   Patient has no known allergies.   Review of Systems Review of Systems  Constitutional: Positive for fatigue. Negative for chills and fever.  HENT: Positive for congestion, postnasal drip, rhinorrhea and sinus pain. Negative for ear pain, sore throat and trouble swallowing.   Eyes: Negative for pain and visual disturbance.  Respiratory: Positive for cough, shortness of breath and wheezing.   Cardiovascular: Negative for chest pain and palpitations.  Gastrointestinal: Negative for abdominal pain and vomiting.  Genitourinary: Negative for dysuria and hematuria.  Musculoskeletal: Negative for arthralgias and back pain.  Skin: Negative for color change and rash.  Neurological: Negative for seizures and syncope.  Psychiatric/Behavioral: Positive for sleep disturbance.  All other systems reviewed and are negative.    Physical Exam Triage Vital Signs ED Triage Vitals  Enc Vitals Group     BP 10/26/18 1825 125/75     Pulse Rate 10/26/18 1825 63  Resp -- 22     Temp 10/26/18 1825 98 F (36.7 C)     Temp Source 10/26/18 1825 Oral     SpO2 10/26/18 1825 96 %   No data found.  Updated Vital Signs BP 125/75   Pulse 63   Temp 98 F (36.7 C) (Oral)   SpO2 96%      Physical Exam  Constitutional: He appears well-developed and well-nourished. No distress.  HENT:  Head: Normocephalic and atraumatic.  Mouth/Throat: Oropharynx is clear and moist. No oropharyngeal exudate.  Eyes: Pupils are equal, round, and reactive to light. Conjunctivae and EOM are normal.  Neck: Normal range of motion.  Cardiovascular: Normal rate, regular rhythm and normal heart sounds.  Pulmonary/Chest: Effort normal.  Tachypnea noted. No respiratory distress. He has wheezes. He has rhonchi.  Abdominal: Soft. He exhibits no distension.  Musculoskeletal: Normal range of motion.       Right lower leg: He exhibits no edema.       Left lower leg: He exhibits no edema.  Lymphadenopathy:    He has cervical adenopathy.  Neurological: He is alert.  Skin: Skin is warm and dry.  Psychiatric: He has a normal mood and affect. His behavior is normal.  Patient had wheezes throughout both lung fields, decreased air movement, tachypnea.  After his DuoNeb treatment he was clearer but still had scattered wheeze.  Anterior rhonchi were prominent.   UC Treatments / Results  Labs (all labs ordered are listed, but only abnormal results are displayed) Labs Reviewed - No data to display  EKG None  Radiology No results found.  Procedures Procedures (including critical care time)  Medications Ordered in UC Medications  ipratropium-albuterol (DUONEB) 0.5-2.5 (3) MG/3ML nebulizer solution 3 mL (3 mLs Nebulization Given 10/26/18 1849)    Initial Impression / Assessment and Plan / UC Course  I have reviewed the triage vital signs and the nursing notes.  Pertinent labs & imaging results that were available during my care of the patient were reviewed by me and considered in my medical decision making (see chart for details).     We reviewed that many other respiratory infections with wheezing are caused by viruses.  Patient has been prone to pneumonia.  He continues to smoke.  He started off with a cold and is having worsening symptoms.  I feel it reasonable to cover him with an antibiotic.  He needs prednisone.  He needs his inhaler.  He is asked to follow-up with his primary care doctor if not better in a few days.  He is given a note for work Final Clinical Impressions(s) / UC Diagnoses   Final diagnoses:  URI with cough and congestion     Discharge Instructions     Push fluids Rest Try Mucinex DM for the  cough and congestion Take prednisone 2 times a day Take antibiotics 2 times a day Use the inhaler every 4 hours as needed for wheezing Return here or to your PCP if not improving in a few days   ED Prescriptions    Medication Sig Dispense Auth. Provider   azithromycin (ZITHROMAX Z-PAK) 250 MG tablet Take two pills today followed by one a day until gone 6 tablet Eustace MooreNelson, Denna Fryberger Sue, MD   predniSONE (DELTASONE) 20 MG tablet Take 1 tablet (20 mg total) by mouth 2 (two) times daily with a meal. 10 tablet Eustace MooreNelson, Wandra Babin Sue, MD   albuterol (PROVENTIL HFA;VENTOLIN HFA) 108 (90 Base) MCG/ACT inhaler Inhale 1-2 puffs into the  lungs every 6 (six) hours as needed for wheezing or shortness of breath. 1 Inhaler Eustace Moore, MD     Controlled Substance Prescriptions [place] Controlled Substance Registry consulted? Not Applicable   Eustace Moore, MD 10/26/18 1946

## 2018-11-02 ENCOUNTER — Ambulatory Visit (HOSPITAL_COMMUNITY)
Admission: EM | Admit: 2018-11-02 | Discharge: 2018-11-02 | Disposition: A | Discharge status: Home / Self Care | Payer: BLUE CROSS/BLUE SHIELD | Attending: Family Medicine | Admitting: Family Medicine

## 2018-11-02 ENCOUNTER — Encounter (HOSPITAL_COMMUNITY): Payer: Self-pay | Admitting: Emergency Medicine

## 2018-11-02 ENCOUNTER — Ambulatory Visit (INDEPENDENT_AMBULATORY_CARE_PROVIDER_SITE_OTHER): Payer: BLUE CROSS/BLUE SHIELD

## 2018-11-02 ENCOUNTER — Other Ambulatory Visit: Payer: Self-pay

## 2018-11-02 DIAGNOSIS — J441 Chronic obstructive pulmonary disease with (acute) exacerbation: Secondary | ICD-10-CM

## 2018-11-02 DIAGNOSIS — Z720 Tobacco use: Secondary | ICD-10-CM | POA: Diagnosis not present

## 2018-11-02 MED ORDER — IPRATROPIUM-ALBUTEROL 0.5-2.5 (3) MG/3ML IN SOLN
3.0000 mL | Freq: Once | RESPIRATORY_TRACT | Status: AC
  Administered 2018-11-02: 3 mL via RESPIRATORY_TRACT

## 2018-11-02 MED ORDER — PREDNISONE 20 MG PO TABS: 20.0000 mg | ORAL_TABLET | Freq: Two times a day (BID) | ORAL | 0 refills | Status: AC

## 2018-11-02 MED ORDER — ALBUTEROL SULFATE HFA 108 (90 BASE) MCG/ACT IN AERS: 1.0000 | INHALATION_SPRAY | Freq: Four times a day (QID) | RESPIRATORY_TRACT | 0 refills | Status: DC | PRN

## 2018-11-02 MED ORDER — IPRATROPIUM-ALBUTEROL 0.5-2.5 (3) MG/3ML IN SOLN
RESPIRATORY_TRACT | Status: AC
  Filled 2018-11-02: qty 3

## 2018-11-02 NOTE — ED Triage Notes (Signed)
Seen 11/24 for sob/wheezing and chest pain with breathing.  Discomfort has not improved and has worsened per patient

## 2018-11-02 NOTE — Discharge Instructions (Addendum)
Your x ray was negative for pneumonia This is a exacerbation of your COPD Breathing treatment given in the clinic I will give you another round of steroids.  Refilled the albuterol inhaler Continue the albuterol inhaler as needed for cough, wheezing, SOB.

## 2018-11-02 NOTE — ED Provider Notes (Signed)
[facility] Provider Note   CSN: [id number] Arrival date & time: 11/02/18  1542     History   Chief Complaint Chief Complaint  Patient presents with  . Shortness of Breath    HPI [name] is a 52 y.o. male.  Pt is a 52 year old male that presents with worsening cough, SOB, congestion. He was seen here on 10/26/18 and treated for PNA. He had z pac, prednisone and has been using the albuterol inhaler and feels like symptoms are worse. He continues to smoke and works in a Occup[employer]. He denies any associted fever, chills, body aches, fatigue.      Past Medical History:  Diagnosis Date  . Migraine   . Migraine with status migrainosus 02/26/2013  . Pneumonia     Patient Active Problem List   Diagnosis Date Noted  . Migraine with status migrainosus 02/26/2013    Past Surgical History:  Procedure Laterality Date  . CHOLECYSTECTOMY  2013  . FINGER SURGERY    . ROTATOR CUFF REPAIR Right 2013  . STOMACH SURGERY          Home Medications    Prior to Admission medications   Medication Sig Start Date End Date Taking? Authorizing Provider  albuterol (PROVENTIL HFA;VENTOLIN HFA) 108 (90 Base) MCG/ACT inhaler Inhale 1-2 puffs into the lungs every 6 (six) hours as needed for wheezing or shortness of breath. 11/02/18   Dahlia Byes A, NP  predniSONE (DELTASONE) 20 MG tablet Take 1 tablet (20 mg total) by mouth 2 (two) times daily with a meal for 5 days. 11/02/18 11/07/18  Janace Aris, NP    Family History Family History  Problem Relation Age of Onset  . Hypertension Mother   . Tuberculosis Mother   . Diabetes Mother     Social History Social History   Tobacco Use  . Smoking status: Current Some Day Smoker    Packs/day: 1.00    Types: Cigarettes  . Smokeless tobacco: Never Used  Substance Use Topics  . Alcohol use: No  . Drug use: Yes    Types: Marijuana     Allergies   Patient has no known  allergies.   Review of Systems Review of Systems   Physical Exam Updated Vital Signs BP 120/79 (BP Location: Right Arm)   Pulse 67   Temp 98 F (36.7 C) (Oral)   Resp 20   SpO2 96%   Physical Exam  Constitutional: He appears well-developed and well-nourished.  Non-toxic appearance. He does not appear ill.  HENT:  Head: Normocephalic.  Mouth/Throat: Oropharynx is clear and moist.  Neck: Normal range of motion.  Cardiovascular: Normal rate, regular rhythm and normal heart sounds.  Pulmonary/Chest: Effort normal. He has wheezes.  Musculoskeletal: Normal range of motion.       Right lower leg: Normal.       Left lower leg: Normal.  Neurological: He is alert.  Skin: Skin is warm and dry.  Psychiatric: He has a normal mood and affect.  Nursing note and vitals reviewed.    ED Treatments / Results  Labs (all labs ordered are listed, but only abnormal results are displayed) Labs Reviewed - No data to display  EKG None  Radiology Dg Chest 2 View  Result Date: 11/02/2018 CLINICAL DATA:  Productive cough and shortness of breath for 1.5 weeks. EXAM: CHEST - 2 VIEW COMPARISON:  PA and lateral chest 12/13/2017. FINDINGS: The chest is hyperexpanded with attenuation  of the pulmonary vasculature. Mild peribronchial thickening is identified. Lungs are clear. No pneumothorax or pleural effusion. Heart size is normal. No acute or focal bony abnormality. IMPRESSION: No acute disease. Findings compatible with COPD. Electronically Signed   By: Drusilla Kannerhomas  Dalessio M.D.   On: 11/02/2018 17:24    Procedures Procedures (including critical care time)  Medications Ordered in ED Medications  ipratropium-albuterol (DUONEB) 0.5-2.5 (3) MG/3ML nebulizer solution 3 mL (3 mLs Nebulization Given 11/02/18 1731)     Initial Impression / Assessment and Plan / ED Course  I have reviewed the triage vital signs and the nursing notes.  Pertinent labs & imaging results that were available during my care  of the patient were reviewed by me and considered in my medical decision making (see chart for details).     X ray negative for PNA consistent with COPD exacerbation Another round of steroids given  Refill on inhaler to use as needed.  Follow up as needed for continued or worsening symptoms   Final Clinical Impressions(s) / ED Diagnoses   Final diagnoses:  COPD exacerbation [facility](HCC)    ED Discharge Orders         Ordered    predniSONE (DELTASONE) 20 MG tablet  2 times daily with meals     11/02/18 1752    albuterol (PROVENTIL HFA;VENTOLIN HFA) 108 (90 Base) MCG/ACT inhaler  Every 6 hours PRN     11/02/18 1752           Dahlia ByesBast, Crystallee Werden A, NP 11/02/18 2048

## 2018-11-11 ENCOUNTER — Encounter (HOSPITAL_COMMUNITY): Payer: Self-pay | Admitting: Emergency Medicine

## 2018-11-11 ENCOUNTER — Emergency Department (HOSPITAL_COMMUNITY): Payer: BLUE CROSS/BLUE SHIELD

## 2018-11-11 ENCOUNTER — Other Ambulatory Visit: Payer: Self-pay

## 2018-11-11 ENCOUNTER — Emergency Department (HOSPITAL_COMMUNITY)
Admission: EM | Admit: 2018-11-11 | Discharge: 2018-11-11 | Disposition: A | Discharge status: Home / Self Care | Payer: BLUE CROSS/BLUE SHIELD | Attending: Emergency Medicine | Admitting: Emergency Medicine

## 2018-11-11 DIAGNOSIS — F17210 Nicotine dependence, cigarettes, uncomplicated: Secondary | ICD-10-CM | POA: Diagnosis not present

## 2018-11-11 DIAGNOSIS — R05 Cough: Secondary | ICD-10-CM | POA: Diagnosis present

## 2018-11-11 DIAGNOSIS — J069 Acute upper respiratory infection, unspecified: Secondary | ICD-10-CM | POA: Diagnosis not present

## 2018-11-11 MED ORDER — IPRATROPIUM-ALBUTEROL 0.5-2.5 (3) MG/3ML IN SOLN
3.0000 mL | Freq: Once | RESPIRATORY_TRACT | Status: AC
  Administered 2018-11-11: 3 mL via RESPIRATORY_TRACT
  Filled 2018-11-11: qty 3

## 2018-11-11 MED ORDER — BENZONATATE 100 MG PO CAPS: 100.0000 mg | ORAL_CAPSULE | Freq: Three times a day (TID) | ORAL | 0 refills | Status: DC

## 2018-11-11 MED ORDER — ALBUTEROL SULFATE HFA 108 (90 BASE) MCG/ACT IN AERS
1.0000 | INHALATION_SPRAY | Freq: Once | RESPIRATORY_TRACT | Status: AC
  Administered 2018-11-11: 2 via RESPIRATORY_TRACT
  Filled 2018-11-11: qty 6.7

## 2018-11-11 MED ORDER — AMOXICILLIN-POT CLAVULANATE 875-125 MG PO TABS: 1.0000 | ORAL_TABLET | Freq: Two times a day (BID) | ORAL | 0 refills | Status: DC

## 2018-11-11 NOTE — ED Notes (Signed)
Pt went back to car to retrieve cell phone. Pt encouraged to stay, but adamant about getting phone. Pt will return.

## 2018-11-11 NOTE — ED Provider Notes (Signed)
[facility] EMERGENCY DEPARTMENT Provider Note   CSN: [id number] Arrival date & time: 11/11/18  1306   History   Chief Complaint Chief Complaint  Patient presents with  . Cough  . Nasal Congestion    HPI [name] is a 52 y.o. male.  HPI   52 year old male presents today with complaints of respiratory infection.  Patient notes approximately 2 weeks ago he developed fever rhinorrhea nasal congestion and cough.  He was seen at urgent care and was started on azithromycin.  He notes symptoms continue to persist with productive cough and fatigue.  He again returned was placed on steroids and albuterol which have not improved his symptoms.  He continues to endorse productive cough, denies any fever at home.  He notes the steroids did not improve his symptoms.  He notes a significant past medical history of pneumonia that required inpatient management.  He notes he continues to smoke, denies any diagnosed history of asthma or COPD.    Past Medical History:  Diagnosis Date  . Migraine   . Migraine with status migrainosus 02/26/2013  . Pneumonia     Patient Active Problem List   Diagnosis Date Noted  . Migraine with status migrainosus 02/26/2013    Past Surgical History:  Procedure Laterality Date  . CHOLECYSTECTOMY  2013  . FINGER SURGERY    . ROTATOR CUFF REPAIR Right 2013  . STOMACH SURGERY          Home Medications    Prior to Admission medications   Medication Sig Start Date End Date Taking? Authorizing Provider  albuterol (PROVENTIL HFA;VENTOLIN HFA) 108 (90 Base) MCG/ACT inhaler Inhale 1-2 puffs into the lungs every 6 (six) hours as needed for wheezing or shortness of breath. 11/02/18   Dahlia Byes A, NP  amoxicillin-clavulanate (AUGMENTIN) 875-125 MG tablet Take 1 tablet by mouth every 12 (twelve) hours. 11/11/18   Hope Brandenburger, Tinnie Gens, PA-C  benzonatate (TESSALON) 100 MG capsule Take 1 capsule (100 mg total) by mouth every 8 (eight) hours.  11/11/18   Eyvonne Mechanic, PA-C    Family History Family History  Problem Relation Age of Onset  . Hypertension Mother   . Tuberculosis Mother   . Diabetes Mother     Social History Social History   Tobacco Use  . Smoking status: Current Some Day Smoker    Packs/day: 1.00    Types: Cigarettes  . Smokeless tobacco: Never Used  Substance Use Topics  . Alcohol use: No  . Drug use: Yes    Types: Marijuana     Allergies   Patient has no known allergies.   Review of Systems Review of Systems  All other systems reviewed and are negative.    Physical Exam Updated Vital Signs BP (!) 140/96   Pulse 61   Temp 98.5 F (36.9 C) (Oral)   Resp 16   Ht 5\' 9"  (1.753 m)   Wt 90.7 kg   SpO2 100%   BMI 29.53 kg/m   Physical Exam  Constitutional: He is oriented to person, place, and time. He appears well-developed and well-nourished.  HENT:  Head: Normocephalic and atraumatic.  Eyes: Pupils are equal, round, and reactive to light. Conjunctivae are normal. Right eye exhibits no discharge. Left eye exhibits no discharge. No scleral icterus.  Neck: Normal range of motion. No JVD present. No tracheal deviation present.  Pulmonary/Chest: Effort normal and breath sounds normal. No stridor. No respiratory distress. He has no wheezes. He has no rales. He  exhibits no tenderness.  Productive cough  Neurological: He is alert and oriented to person, place, and time. Coordination normal.  Psychiatric: He has a normal mood and affect. His behavior is normal. Judgment and thought content normal.  Nursing note and vitals reviewed.    ED Treatments / Results  Labs (all labs ordered are listed, but only abnormal results are displayed) Labs Reviewed - No data to display  EKG None  Radiology Dg Chest 2 View  Result Date: 11/11/2018 CLINICAL DATA:  Midline chest pain radiating to the back for 2 weeks, cough for a month, smoking history EXAM: CHEST - 2 VIEW COMPARISON:  Chest x-ray  of 11/02/2017 FINDINGS: The lungs remain somewhat hyperaerated with increased AP diameter. There are prominent central markings with peribronchial thickening most consistent with bronchitis. No pneumonia or pleural effusion is noted. The heart is within normal limits in size. No bony abnormality is seen. IMPRESSION: 1. No pneumonia or effusion.  Slight hyper aeration. 2. Probable bronchitis with peribronchial thickening noted. Electronically Signed   By: Dwyane DeePaul  Barry M.D.   On: 11/11/2018 14:00    Procedures Procedures (including critical care time)  Medications Ordered in ED Medications  ipratropium-albuterol (DUONEB) 0.5-2.5 (3) MG/3ML nebulizer solution 3 mL (3 mLs Nebulization Given 11/11/18 1533)  albuterol (PROVENTIL HFA;VENTOLIN HFA) 108 (90 Base) MCG/ACT inhaler 1-2 puff (2 puffs Inhalation Given 11/11/18 1533)     Initial Impression / Assessment and Plan / ED Course  I have reviewed the triage vital signs and the nursing notes.  Pertinent labs & imaging results that were available during my care of the patient were reviewed by me and considered in my medical decision making (see chart for details).     52 year old male presents today with 2 weeks of upper respiratory infection.  I do have some suspicion that his symptoms are lingering from initial infection although patient reports continued fatigue and infectious symptoms.  I do find it reasonable to treat with Augmentin as he has had atypical coverage with the azithromycin.  Patient will follow-up as an outpatient he will return immediately if develops any new or worsening signs or symptoms.  Patient verbalized understanding and agreement to today's plan had no further questions or concerns.  Final Clinical Impressions(s) / ED Diagnoses   Final diagnoses:  Upper respiratory tract infection, unspecified type    ED Discharge Orders         Ordered    amoxicillin-clavulanate (AUGMENTIN) 875-125 MG tablet  Every 12 hours,    Status:  Discontinued     11/11/18 1546    benzonatate (TESSALON) 100 MG capsule  Every 8 hours,   Status:  Discontinued     11/11/18 1546    amoxicillin-clavulanate (AUGMENTIN) 875-125 MG tablet  Every 12 hours     11/11/18 1546    benzonatate (TESSALON) 100 MG capsule  Every 8 hours     11/11/18 1546           Eyvonne MechanicHedges, Norvella Loscalzo, PA-C 11/11/18 1547    Mancel BaleWentz, Elliott, MD 11/12/18 2112

## 2018-11-11 NOTE — ED Triage Notes (Signed)
Not in WR x1

## 2018-11-11 NOTE — Discharge Instructions (Addendum)
Please read attached information. If you experience any new or worsening signs or symptoms please return to the emergency room for evaluation. Please follow-up with your primary care provider or specialist as discussed. Please use medication prescribed only as directed and discontinue taking if you have any concerning signs or symptoms.   °

## 2018-11-11 NOTE — ED Triage Notes (Signed)
Onset 2 weeks ago developed nasal congestion and intermittent productive cough yellow sputum. Seen at urgent care twice. Was given a albuterol inhaler ran out and the second visit they would not given patient a refill. Concerned not feeling better after two weeks.

## 2018-11-11 NOTE — ED Notes (Signed)
Patient transported to X-ray 

## 2018-11-26 ENCOUNTER — Emergency Department (HOSPITAL_COMMUNITY): Payer: BLUE CROSS/BLUE SHIELD

## 2018-11-26 ENCOUNTER — Other Ambulatory Visit: Payer: Self-pay

## 2018-11-26 ENCOUNTER — Emergency Department (HOSPITAL_COMMUNITY)
Admission: EM | Admit: 2018-11-26 | Discharge: 2018-11-26 | Disposition: A | Discharge status: Home / Self Care | Payer: BLUE CROSS/BLUE SHIELD | Attending: Emergency Medicine | Admitting: Emergency Medicine

## 2018-11-26 DIAGNOSIS — R0602 Shortness of breath: Secondary | ICD-10-CM | POA: Diagnosis present

## 2018-11-26 DIAGNOSIS — R05 Cough: Secondary | ICD-10-CM | POA: Insufficient documentation

## 2018-11-26 DIAGNOSIS — R062 Wheezing: Secondary | ICD-10-CM | POA: Diagnosis not present

## 2018-11-26 DIAGNOSIS — J441 Chronic obstructive pulmonary disease with (acute) exacerbation: Secondary | ICD-10-CM | POA: Diagnosis not present

## 2018-11-26 DIAGNOSIS — F17210 Nicotine dependence, cigarettes, uncomplicated: Secondary | ICD-10-CM | POA: Diagnosis not present

## 2018-11-26 DIAGNOSIS — R0789 Other chest pain: Secondary | ICD-10-CM | POA: Insufficient documentation

## 2018-11-26 LAB — CBC WITH DIFFERENTIAL/PLATELET
ABS IMMATURE GRANULOCYTES: 0.01 10*3/uL (ref 0.00–0.07)
BASOS PCT: 1 %
Basophils Absolute: 0 10*3/uL (ref 0.0–0.1)
EOS ABS: 1.3 10*3/uL — AB (ref 0.0–0.5)
Eosinophils Relative: 27 %
HCT: 43.1 % (ref 39.0–52.0)
Hemoglobin: 14.5 g/dL (ref 13.0–17.0)
Immature Granulocytes: 0 %
Lymphocytes Relative: 27 %
Lymphs Abs: 1.3 10*3/uL (ref 0.7–4.0)
MCH: 29.7 pg (ref 26.0–34.0)
MCHC: 33.6 g/dL (ref 30.0–36.0)
MCV: 88.1 fL (ref 80.0–100.0)
MONO ABS: 0.2 10*3/uL (ref 0.1–1.0)
MONOS PCT: 5 %
NEUTROS ABS: 1.9 10*3/uL (ref 1.7–7.7)
Neutrophils Relative %: 40 %
PLATELETS: 282 10*3/uL (ref 150–400)
RBC: 4.89 MIL/uL (ref 4.22–5.81)
RDW: 14.5 % (ref 11.5–15.5)
WBC: 4.7 10*3/uL (ref 4.0–10.5)
nRBC: 0 % (ref 0.0–0.2)

## 2018-11-26 LAB — BASIC METABOLIC PANEL
Anion gap: 6 (ref 5–15)
BUN: 10 mg/dL (ref 6–20)
CALCIUM: 9.3 mg/dL (ref 8.9–10.3)
CO2: 26 mmol/L (ref 22–32)
CREATININE: 0.87 mg/dL (ref 0.61–1.24)
Chloride: 105 mmol/L (ref 98–111)
GFR calc Af Amer: >60 mL/min (ref >60)
GLUCOSE: 93 mg/dL (ref 70–99)
Potassium: 4.2 mmol/L (ref 3.5–5.1)
Sodium: 137 mmol/L (ref 135–145)

## 2018-11-26 LAB — TROPONIN I: Troponin I: <0.03 ng/mL (ref <0.03)

## 2018-11-26 MED ORDER — IPRATROPIUM-ALBUTEROL 0.5-2.5 (3) MG/3ML IN SOLN
3.0000 mL | Freq: Once | RESPIRATORY_TRACT | Status: AC
  Administered 2018-11-26: 3 mL via RESPIRATORY_TRACT
  Filled 2018-11-26: qty 3

## 2018-11-26 MED ORDER — KETOROLAC TROMETHAMINE 15 MG/ML IJ SOLN
15.0000 mg | Freq: Once | INTRAMUSCULAR | Status: AC
  Administered 2018-11-26: 15 mg via INTRAVENOUS
  Filled 2018-11-26: qty 1

## 2018-11-26 MED ORDER — ALBUTEROL SULFATE HFA 108 (90 BASE) MCG/ACT IN AERS
1.0000 | INHALATION_SPRAY | Freq: Once | RESPIRATORY_TRACT | Status: AC
  Administered 2018-11-26: 2 via RESPIRATORY_TRACT
  Filled 2018-11-26: qty 6.7

## 2018-11-26 NOTE — ED Notes (Signed)
Patient transported to X-ray 

## 2018-11-26 NOTE — Discharge Instructions (Signed)
Continue to use inhaler for shortness of breath and wheezing Please follow up with a primary doctor for management of COPD and for refills of inhaler Return if worsening

## 2018-11-26 NOTE — ED Triage Notes (Signed)
Pt reports breathing issues for 3-4 weeks. Today pt is complaining of left sided rib pain when coughing or not coughing.

## 2018-11-26 NOTE — ED Provider Notes (Signed)
[facility] Provider Note   CSN: [id number] Arrival date & time: 11/26/18  1559     History   Chief Complaint Chief Complaint  Patient presents with  . Shortness of Breath    HPI [name] is a 52 y.o. male who presents with SOB and rib pain. PMH significant for migraines, hx of pneumonia, smoking, probable COPD. He states that he has been having SOB, wheezing, and productive cough for the past 3 to 4 weeks.  This is his fourth visit for the same.  He reports that last night he was very short of breath when lying down and had severe coughing fit and developed acute onset of left-sided lower rib pain.  It is constant and worse with coughing or moving.  He has used up the last inhaler that was given to him.  He took antibiotics, cough medicine, steroids without significant relief.  He continues to smoke.  He denies fever, chills, chest pain, syncope, abdominal pain, nausea, vomiting, leg swelling.  HPI  Past Medical History:  Diagnosis Date  . Migraine   . Migraine with status migrainosus 02/26/2013  . Pneumonia     Patient Active Problem List   Diagnosis Date Noted  . Migraine with status migrainosus 02/26/2013    Past Surgical History:  Procedure Laterality Date  . CHOLECYSTECTOMY  2013  . FINGER SURGERY    . ROTATOR CUFF REPAIR Right 2013  . STOMACH SURGERY          Home Medications    Prior to Admission medications   Medication Sig Start Date End Date Taking? Authorizing Provider  albuterol (PROVENTIL HFA;VENTOLIN HFA) 108 (90 Base) MCG/ACT inhaler Inhale 1-2 puffs into the lungs every 6 (six) hours as needed for wheezing or shortness of breath. 11/02/18   Dahlia ByesBast, Traci A, NP  amoxicillin-clavulanate (AUGMENTIN) 875-125 MG tablet Take 1 tablet by mouth every 12 (twelve) hours. 11/11/18   Hedges, Tinnie GensJeffrey, PA-C  benzonatate (TESSALON) 100 MG capsule Take 1 capsule (100 mg total) by mouth every 8 (eight) hours. 11/11/18    Eyvonne MechanicHedges, Jeffrey, PA-C    Family History Family History  Problem Relation Age of Onset  . Hypertension Mother   . Tuberculosis Mother   . Diabetes Mother     Social History Social History   Tobacco Use  . Smoking status: Current Some Day Smoker    Packs/day: 1.00    Types: Cigarettes  . Smokeless tobacco: Never Used  Substance Use Topics  . Alcohol use: No  . Drug use: Yes    Types: Marijuana     Allergies   Patient has no known allergies.   Review of Systems Review of Systems  Constitutional: Negative for chills and fever.  Respiratory: Positive for cough, shortness of breath and wheezing.   Cardiovascular: Positive for chest pain (left rib). Negative for leg swelling.  Gastrointestinal: Negative for abdominal pain, nausea and vomiting.  All other systems reviewed and are negative.    Physical Exam Updated Vital Signs BP (!) 137/94 (BP Location: Right Arm)   Pulse 81   Temp 97.8 F (36.6 C) (Oral)   Resp 19   Ht 5\' 9"  (1.753 m)   Wt 90.7 kg   SpO2 99%   BMI 29.53 kg/m   Physical Exam Vitals signs and nursing note reviewed.  Constitutional:      General: He is not in acute distress.    Appearance: He is well-developed. He is not ill-appearing.  Comments: Calm and cooperative. Frequent coarse sounding cough  HENT:     Head: Normocephalic and atraumatic.  Eyes:     General: No scleral icterus.       Right eye: No discharge.        Left eye: No discharge.     Conjunctiva/sclera: Conjunctivae normal.     Pupils: Pupils are equal, round, and reactive to light.  Neck:     Musculoskeletal: Normal range of motion.  Cardiovascular:     Rate and Rhythm: Normal rate.  Pulmonary:     Effort: Pulmonary effort is normal. No respiratory distress.     Breath sounds: Wheezing (diffuse expiratory wheezes) present. No decreased breath sounds, rhonchi or rales.  Chest:     Chest wall: Tenderness (left lower lateral ribs) present.  Abdominal:     General:  There is no distension.  Musculoskeletal:     Right lower leg: No edema.     Left lower leg: No edema.  Skin:    General: Skin is warm and dry.  Neurological:     Mental Status: He is alert and oriented to person, place, and time.  Psychiatric:        Behavior: Behavior normal.      ED Treatments / Results  Labs (all labs ordered are listed, but only abnormal results are displayed) Labs Reviewed  CBC WITH DIFFERENTIAL/PLATELET - Abnormal; Notable for the following components:      Result Value   Eosinophils Absolute 1.3 (*)    All other components within normal limits  BASIC METABOLIC PANEL  TROPONIN I  I-STAT TROPONIN, ED    EKG None  Radiology Dg Chest 2 View  Result Date: 11/26/2018 CLINICAL DATA:  Left-sided chest pain, cough and shortness-of-breath. EXAM: CHEST - 2 VIEW COMPARISON:  None. FINDINGS: Lungs are adequately inflated without focal airspace consolidation or effusion. Cardiomediastinal silhouette and remainder the exam is unchanged. IMPRESSION: No active cardiopulmonary disease. Electronically Signed   By: Elberta Fortisaniel  Boyle M.D.   On: 11/26/2018 17:01    Procedures Procedures (including critical care time)  Medications Ordered in ED Medications  ipratropium-albuterol (DUONEB) 0.5-2.5 (3) MG/3ML nebulizer solution 3 mL (3 mLs Nebulization Given 11/26/18 1726)  ketorolac (TORADOL) 15 MG/ML injection 15 mg (15 mg Intravenous Given 11/26/18 1726)  ipratropium-albuterol (DUONEB) 0.5-2.5 (3) MG/3ML nebulizer solution 3 mL (3 mLs Nebulization Given 11/26/18 1813)  albuterol (PROVENTIL HFA;VENTOLIN HFA) 108 (90 Base) MCG/ACT inhaler 1-2 puff (2 puffs Inhalation Given 11/26/18 1849)     Initial Impression / Assessment and Plan / ED Course  I have reviewed the triage vital signs and the nursing notes.  Pertinent labs & imaging results that were available during my care of the patient were reviewed by me and considered in my medical decision making (see chart for  details).  52 year old male presents with SOB, cough, wheezing, rib pain. He is mildly hypertensive but otherwise vitals are normal. On exam he has diffuse expiratory wheezing. He has tenderness over the left ribs.  Will obtain labs, EKG, CXR and give breathing tx and Toradol.  EKG shows ST elevation in leads V3,V4,V5. This appears to be unchanged from prior. Symptoms are not consistent with ACS. CXR is negative. Reassessed pt after breathing tx. He is still wheezing. Will give another breathing tx.   CBC is remarkable for elevation of eosinophils. BMP is normal. He was ambulated in the hall and sats remained >95%. He feels better after the second treatment. Troponin is normal.  Will not repeat since it's been almost 24 hours of pain and seems MSK. He was advised to f/u with PCP and return if worsening  Final Clinical Impressions(s) / ED Diagnoses   Final diagnoses:  COPD exacerbation [facility])    ED Discharge Orders    None       Beryle Quant 11/26/18 2013    Tilden Fossa, MD 11/27/18 [phone]

## 2018-12-02 ENCOUNTER — Emergency Department (HOSPITAL_COMMUNITY)
Admission: EM | Admit: 2018-12-02 | Discharge: 2018-12-02 | Disposition: A | Discharge status: Home / Self Care | Payer: BLUE CROSS/BLUE SHIELD | Attending: Emergency Medicine | Admitting: Emergency Medicine

## 2018-12-02 ENCOUNTER — Encounter (HOSPITAL_COMMUNITY): Payer: Self-pay | Admitting: *Deleted

## 2018-12-02 ENCOUNTER — Emergency Department (HOSPITAL_COMMUNITY): Payer: BLUE CROSS/BLUE SHIELD

## 2018-12-02 ENCOUNTER — Other Ambulatory Visit: Payer: Self-pay

## 2018-12-02 DIAGNOSIS — F17210 Nicotine dependence, cigarettes, uncomplicated: Secondary | ICD-10-CM | POA: Diagnosis not present

## 2018-12-02 DIAGNOSIS — J441 Chronic obstructive pulmonary disease with (acute) exacerbation: Secondary | ICD-10-CM | POA: Diagnosis not present

## 2018-12-02 DIAGNOSIS — R0602 Shortness of breath: Secondary | ICD-10-CM | POA: Diagnosis present

## 2018-12-02 HISTORY — DX: Chronic obstructive pulmonary disease, unspecified: J44.9

## 2018-12-02 LAB — CBC WITH DIFFERENTIAL/PLATELET
Abs Immature Granulocytes: 0.01 10*3/uL (ref 0.00–0.07)
Basophils Absolute: 0 10*3/uL (ref 0.0–0.1)
Basophils Relative: 1 %
Eosinophils Absolute: 0.7 10*3/uL — ABNORMAL HIGH (ref 0.0–0.5)
Eosinophils Relative: 13 %
HEMATOCRIT: 41.5 % (ref 39.0–52.0)
Hemoglobin: 13.8 g/dL (ref 13.0–17.0)
Immature Granulocytes: 0 %
LYMPHS ABS: 1.6 10*3/uL (ref 0.7–4.0)
Lymphocytes Relative: 32 %
MCH: 29.4 pg (ref 26.0–34.0)
MCHC: 33.3 g/dL (ref 30.0–36.0)
MCV: 88.5 fL (ref 80.0–100.0)
Monocytes Absolute: 0.3 10*3/uL (ref 0.1–1.0)
Monocytes Relative: 7 %
Neutro Abs: 2.3 10*3/uL (ref 1.7–7.7)
Neutrophils Relative %: 47 %
Platelets: 237 10*3/uL (ref 150–400)
RBC: 4.69 MIL/uL (ref 4.22–5.81)
RDW: 14.5 % (ref 11.5–15.5)
WBC: 4.9 10*3/uL (ref 4.0–10.5)
nRBC: 0 % (ref 0.0–0.2)

## 2018-12-02 LAB — BASIC METABOLIC PANEL
Anion gap: 8 (ref 5–15)
BUN: 10 mg/dL (ref 6–20)
CO2: 23 mmol/L (ref 22–32)
Calcium: 9.5 mg/dL (ref 8.9–10.3)
Chloride: 108 mmol/L (ref 98–111)
Creatinine, Ser: 0.9 mg/dL (ref 0.61–1.24)
GFR calc non Af Amer: >60 mL/min (ref >60)
Glucose, Bld: 92 mg/dL (ref 70–99)
Potassium: 4.3 mmol/L (ref 3.5–5.1)
Sodium: 139 mmol/L (ref 135–145)

## 2018-12-02 LAB — I-STAT TROPONIN, ED: Troponin i, poc: 0.01 ng/mL (ref 0.00–0.08)

## 2018-12-02 MED ORDER — ALBUTEROL SULFATE (2.5 MG/3ML) 0.083% IN NEBU
5.0000 mg | INHALATION_SOLUTION | Freq: Once | RESPIRATORY_TRACT | Status: AC
  Administered 2018-12-02: 5 mg via RESPIRATORY_TRACT
  Filled 2018-12-02: qty 6

## 2018-12-02 MED ORDER — MAGNESIUM SULFATE 2 GM/50ML IV SOLN
2.0000 g | Freq: Once | INTRAVENOUS | Status: AC
  Administered 2018-12-02: 2 g via INTRAVENOUS
  Filled 2018-12-02: qty 50

## 2018-12-02 MED ORDER — IPRATROPIUM-ALBUTEROL 0.5-2.5 (3) MG/3ML IN SOLN
3.0000 mL | Freq: Once | RESPIRATORY_TRACT | Status: AC
  Administered 2018-12-02: 3 mL via RESPIRATORY_TRACT
  Filled 2018-12-02: qty 3

## 2018-12-02 MED ORDER — ALBUTEROL (5 MG/ML) CONTINUOUS INHALATION SOLN
5.0000 mg/h | INHALATION_SOLUTION | Freq: Once | RESPIRATORY_TRACT | Status: AC
  Administered 2018-12-02: 5 mg/h via RESPIRATORY_TRACT
  Filled 2018-12-02 (×2): qty 20

## 2018-12-02 MED ORDER — ALBUTEROL SULFATE HFA 108 (90 BASE) MCG/ACT IN AERS
2.0000 | INHALATION_SPRAY | RESPIRATORY_TRACT | Status: DC | PRN
  Administered 2018-12-02: 2 via RESPIRATORY_TRACT
  Filled 2018-12-02: qty 6.7

## 2018-12-02 MED ORDER — PREDNISONE 20 MG PO TABS
60.0000 mg | ORAL_TABLET | Freq: Once | ORAL | Status: AC
  Administered 2018-12-02: 60 mg via ORAL
  Filled 2018-12-02: qty 3

## 2018-12-02 MED ORDER — PREDNISONE 20 MG PO TABS: 60.0000 mg | ORAL_TABLET | Freq: Every day | ORAL | 0 refills | Status: DC

## 2018-12-02 MED ORDER — PREDNISONE 20 MG PO TABS: 60.0000 mg | ORAL_TABLET | Freq: Every day | ORAL | 0 refills | Status: AC

## 2018-12-02 NOTE — ED Notes (Signed)
Pt ambulated well with no reported weakness or dizziness.  Maintained SPO2 of about 95% with high of 97%

## 2018-12-02 NOTE — ED Notes (Signed)
Pt stable, ambulatory, states understanding of discharge instructions 

## 2018-12-02 NOTE — ED Provider Notes (Signed)
[facility] EMERGENCY DEPARTMENT Provider Note   CSN: [id number] Arrival date & time: 12/02/18  1532   History   Chief Complaint Chief Complaint  Patient presents with  . Shortness of Breath    HPI [name] is a 52 y.o. male with past medical history significant for COPD, chronic migraine who presents for evaluation of shortness of breath.  Patient states he has been seen approximately 2 times over the last 6 weeks for similar symptoms.  States he was recently diagnosed with COPD.  States he has not followed up with his PCP for evaluation after his multiple emergency room visits.  States he has had increasing shortness of breath over the last 2 days.  States he has not had an albuterol inhaler as he states his insurance would not let him refill the prescription.  States he has had shortness of breath with ambulation and wakes up in the middle the night with shortness of breath.  Patient states when he did have his albuterol inhaler he was able to control his symptoms.  Has had nonproductive cough as well as chest tightness when he gets short of breath.  Denies fever, chills, nausea, vomiting, radiation of pain, diaphoresis, abdominal pain, dysuria, diarrhea or constipation, lower extremity swelling, palpitations.  Patient states his last use of prednisone was approximately 5 weeks ago.  He has been out of his albuterol inhaler x3 weeks.  Patient states "I can hear myself wheezing."  Denies previous hospitalization or intubation for COPD.  History obtained from patient.  No interpreter was used.  HPI  Past Medical History:  Diagnosis Date  . COPD (chronic obstructive pulmonary disease) (HCC)   . Migraine   . Migraine with status migrainosus 02/26/2013  . Pneumonia     Patient Active Problem List   Diagnosis Date Noted  . Migraine with status migrainosus 02/26/2013    Past Surgical History:  Procedure Laterality Date  . CHOLECYSTECTOMY  2013  . FINGER  SURGERY    . ROTATOR CUFF REPAIR Right 2013  . STOMACH SURGERY          Home Medications    Prior to Admission medications   Medication Sig Start Date End Date Taking? Authorizing Provider  albuterol (PROVENTIL HFA;VENTOLIN HFA) 108 (90 Base) MCG/ACT inhaler Inhale 1-2 puffs into the lungs every 6 (six) hours as needed for wheezing or shortness of breath. 11/02/18  Yes Bast, Traci A, NP  benzonatate (TESSALON) 100 MG capsule Take 1 capsule (100 mg total) by mouth every 8 (eight) hours. Patient taking differently: Take 100 mg by mouth every 8 (eight) hours as needed for cough.  11/11/18  Yes Hedges, Tinnie Gens, PA-C  amoxicillin-clavulanate (AUGMENTIN) 875-125 MG tablet Take 1 tablet by mouth every 12 (twelve) hours. Patient not taking: Reported on 12/02/2018 11/11/18   Hedges, Tinnie Gens, PA-C  predniSONE (DELTASONE) 20 MG tablet Take 3 tablets (60 mg total) by mouth daily for 4 days. 12/02/18 12/06/18  ,  A, PA-C    Family History Family History  Problem Relation Age of Onset  . Hypertension Mother   . Tuberculosis Mother   . Diabetes Mother     Social History Social History   Tobacco Use  . Smoking status: Current Some Day Smoker    Packs/day: 1.00    Types: Cigarettes  . Smokeless tobacco: Never Used  Substance Use Topics  . Alcohol use: No  . Drug use: Yes    Types: Marijuana     Allergies  Patient has no known allergies.   Review of Systems Review of Systems  Constitutional: Negative.   HENT: Negative.   Respiratory: Positive for cough, chest tightness, shortness of breath and wheezing. Negative for choking and stridor.   Cardiovascular: Negative.   Gastrointestinal: Negative.   Genitourinary: Negative.   Musculoskeletal: Negative.   Skin: Negative.   Neurological: Negative.   All other systems reviewed and are negative.    Physical Exam Updated Vital Signs BP (!) 142/76 (BP Location: Right Arm)   Pulse 70   Temp 97.7 F (36.5 C) (Oral)    Resp 20   SpO2 97%   Physical Exam Vitals signs and nursing note reviewed.  Constitutional:      General: He is not in acute distress.    Appearance: He is well-developed. He is not ill-appearing, toxic-appearing or diaphoretic.     Comments: Able to speak in full sentences without difficulty.  HENT:     Head: Normocephalic and atraumatic.     Mouth/Throat:     Mouth: Mucous membranes are moist.  Eyes:     Pupils: Pupils are equal, round, and reactive to light.  Neck:     Musculoskeletal: Normal range of motion and neck supple.  Cardiovascular:     Rate and Rhythm: Normal rate and regular rhythm.     Pulses: Normal pulses.     Heart sounds: No murmur. No friction rub. No gallop.   Pulmonary:     Effort: Pulmonary effort is normal. Tachypnea present. No accessory muscle usage or respiratory distress.     Breath sounds: No stridor. Wheezing present. No decreased breath sounds, rhonchi or rales.     Comments: Mild tachypnea at 22, moderate diffuse expiratory wheezes throughout.  No decreased lung sounds.  No accessory muscle usage.  No pursed lips. Abdominal:     General: There is no distension.     Palpations: Abdomen is soft.     Comments: Soft, nontender without rebound or guarding.  Musculoskeletal: Normal range of motion.     Right lower leg: No edema.     Left lower leg: No edema.     Comments: No lower extremity edema.  Skin:    General: Skin is warm and dry.  Neurological:     Mental Status: He is alert.      ED Treatments / Results  Labs (all labs ordered are listed, but only abnormal results are displayed) Labs Reviewed  CBC WITH DIFFERENTIAL/PLATELET - Abnormal; Notable for the following components:      Result Value   Eosinophils Absolute 0.7 (*)    All other components within normal limits  BASIC METABOLIC PANEL  I-STAT TROPONIN, ED    EKG EKG Interpretation  Date/Time:  Tuesday December 02 2018 15:45:59 EST Ventricular Rate:  70 PR  Interval:  180 QRS Duration: 82 QT Interval:  354 QTC Calculation: 382 R Axis:   89 Text Interpretation:  Normal sinus rhythm Left ventricular hypertrophy Abnormal ECG similar to prior EKGs Confirmed by Rolan BuccoBelfi, Melanie [phone]([id number]) on 12/02/2018 6:22:59 PM   Radiology Dg Chest 2 View  Result Date: 12/02/2018 CLINICAL DATA:  Shortness of breath EXAM: CHEST - 2 VIEW COMPARISON:  November 26, 2018 FINDINGS: There is no appreciable edema or consolidation. The heart size and pulmonary vascularity are normal. No adenopathy. There is evidence of old trauma involving the lateral right clavicle with stable acromioclavicular separation on the right. IMPRESSION: No edema or consolidation. Old trauma lateral right clavicle with acromioclavicular separation on  the right. Electronically Signed   By: Bretta BangWilliam  Woodruff III M.D.   On: 12/02/2018 17:08    Procedures Procedures (including critical care time)  Medications Ordered in ED Medications  albuterol (PROVENTIL HFA;VENTOLIN HFA) 108 (90 Base) MCG/ACT inhaler 2 puff (2 puffs Inhalation Given 12/02/18 2103)  albuterol (PROVENTIL) (2.5 MG/3ML) 0.083% nebulizer solution 5 mg (5 mg Nebulization Given 12/02/18 1556)  predniSONE (DELTASONE) tablet 60 mg (60 mg Oral Given 12/02/18 1643)  ipratropium-albuterol (DUONEB) 0.5-2.5 (3) MG/3ML nebulizer solution 3 mL (3 mLs Nebulization Given 12/02/18 1747)  magnesium sulfate IVPB 2 g 50 mL (0 g Intravenous Stopped 12/02/18 2006)  albuterol (PROVENTIL,VENTOLIN) solution continuous neb (5 mg/hr Nebulization Given 12/02/18 1915)     Initial Impression / Assessment and Plan / ED Course  I have reviewed the triage vital signs and the nursing notes.  Pertinent labs & imaging results that were available during my care of the patient were reviewed by me and considered in my medical decision making (see chart for details).  [id number]: 52 year old male who appears otherwise well presents for evaluation of shortness of breath.   Patient has been seen in the emergency department 3x times over the last 5 to 6 weeks for similar symptoms.  States he was recently diagnosed with COPD.  Has not followed up with his PCP after discharge for emergency department.  Not been admitted to the hospital for his symptoms.  Has been out of his albuterol inhaler x3 weeks.  Last use of prednisone approximately 5 weeks ago.  Able to speak in full sentences without difficulty.  Afebrile, nonseptic, non-ill-appearing.  He does have moderately diffuse expiratory wheezing.  No decreased breath sounds, no rales or rhonchi.  Abdomen soft, nontender without rebound or guarding.  No lower extremity swelling.  Patient was being administered albuterol inhaler on my initial evaluation.  We will give steroids and reevaluate.  CBC without leukocytosis, metabolic panel without any evidence of electrolyte or renal or liver abnormalities, i-STAT troponin negative.  EKG without any evidence of ST elevation or ischemic changes.  Chest x-ray without evidence of infiltrates, CHF, and pulmonary edema or pneumothorax.  1845: Patient has received 2 DuoNeb treatments as well as steroids.  On reevaluation patient with mild expiratory wheezing on right.  Will give continuous nebulizer as well as magnesium and reevaluate.  Discussed with patient possible admission for COPD exacerbation.  Patient states he does not want admission at this time.    2015: On reevaluation patient with lungs clear to auscultation bilaterally without wheeze, rhonchi or rales.  No evidence of tachycardia, tachypnea or hypoxia.  Patient states he would like to be DC home at this time.  Patient was able to ambulate in hall without any dyspnea on exertion with oxygen saturations ranging from 95 to 97%.  Will DC home on steroids as well as give patient albuterol inhaler.  Discussed follow-up with PCP for reevaluation.  Patient is hemodynamically stable and appropriate for DC home at this time.  Low suspicion  for ACS, PE, dissection causing patient's symptoms at this time.  Discussed strict return precautions with patient.  Patient voiced understanding and is agreeable for follow-up.   Final Clinical Impressions(s) / ED Diagnoses   Final diagnoses:  COPD exacerbation [facility](HCC)    ED Discharge Orders         Ordered    predniSONE (DELTASONE) 20 MG tablet  Daily,   Status:  Discontinued     12/02/18 2053    predniSONE (DELTASONE) 20  MG tablet  Daily     12/02/18 2054           ,  A, PA-C 12/02/18 2105    Rolan Bucco, MD 12/03/18 1102

## 2018-12-02 NOTE — ED Triage Notes (Signed)
Pt is here with sob and wheezing and reports discomfort on his left side rib cage.  pT reports cough and denies fever

## 2018-12-02 NOTE — Discharge Instructions (Addendum)
Your evaluated today for COPD.  I have prescribed you steroids.  Please take as prescribed.  I have also given you an albuterol inhaler.  Please follow-up with your PCP for reevaluation.  Return Turn to the ED for any new or worsening symptoms.

## 2019-03-25 NOTE — Progress Notes (Signed)
Error

## 2019-03-26 ENCOUNTER — Other Ambulatory Visit: Payer: Self-pay

## 2019-03-26 ENCOUNTER — Telehealth: Payer: BLUE CROSS/BLUE SHIELD | Admitting: Neurology

## 2019-04-15 ENCOUNTER — Telehealth: Payer: Self-pay | Admitting: Neurology

## 2019-04-15 NOTE — Telephone Encounter (Signed)
Darel Hong is calling from [facility] needing office visit notes from the first visit in April. Please fax this to [phone]. Thanks!

## 2019-04-15 NOTE — Telephone Encounter (Signed)
Called and advised Judy Pt N/S

## 2019-06-18 ENCOUNTER — Ambulatory Visit: Payer: BC Managed Care – PPO | Admitting: Podiatry

## 2019-06-22 ENCOUNTER — Ambulatory Visit: Payer: BC Managed Care – PPO | Admitting: Podiatry

## 2019-07-10 IMAGING — CR DG CHEST 2V
2 series · 2 of 2 positions shown · non-contrast
Comparison: November 26, 2018

CLINICAL DATA: Shortness of breath

EXAM:
CHEST - 2 VIEW

[chest pa]
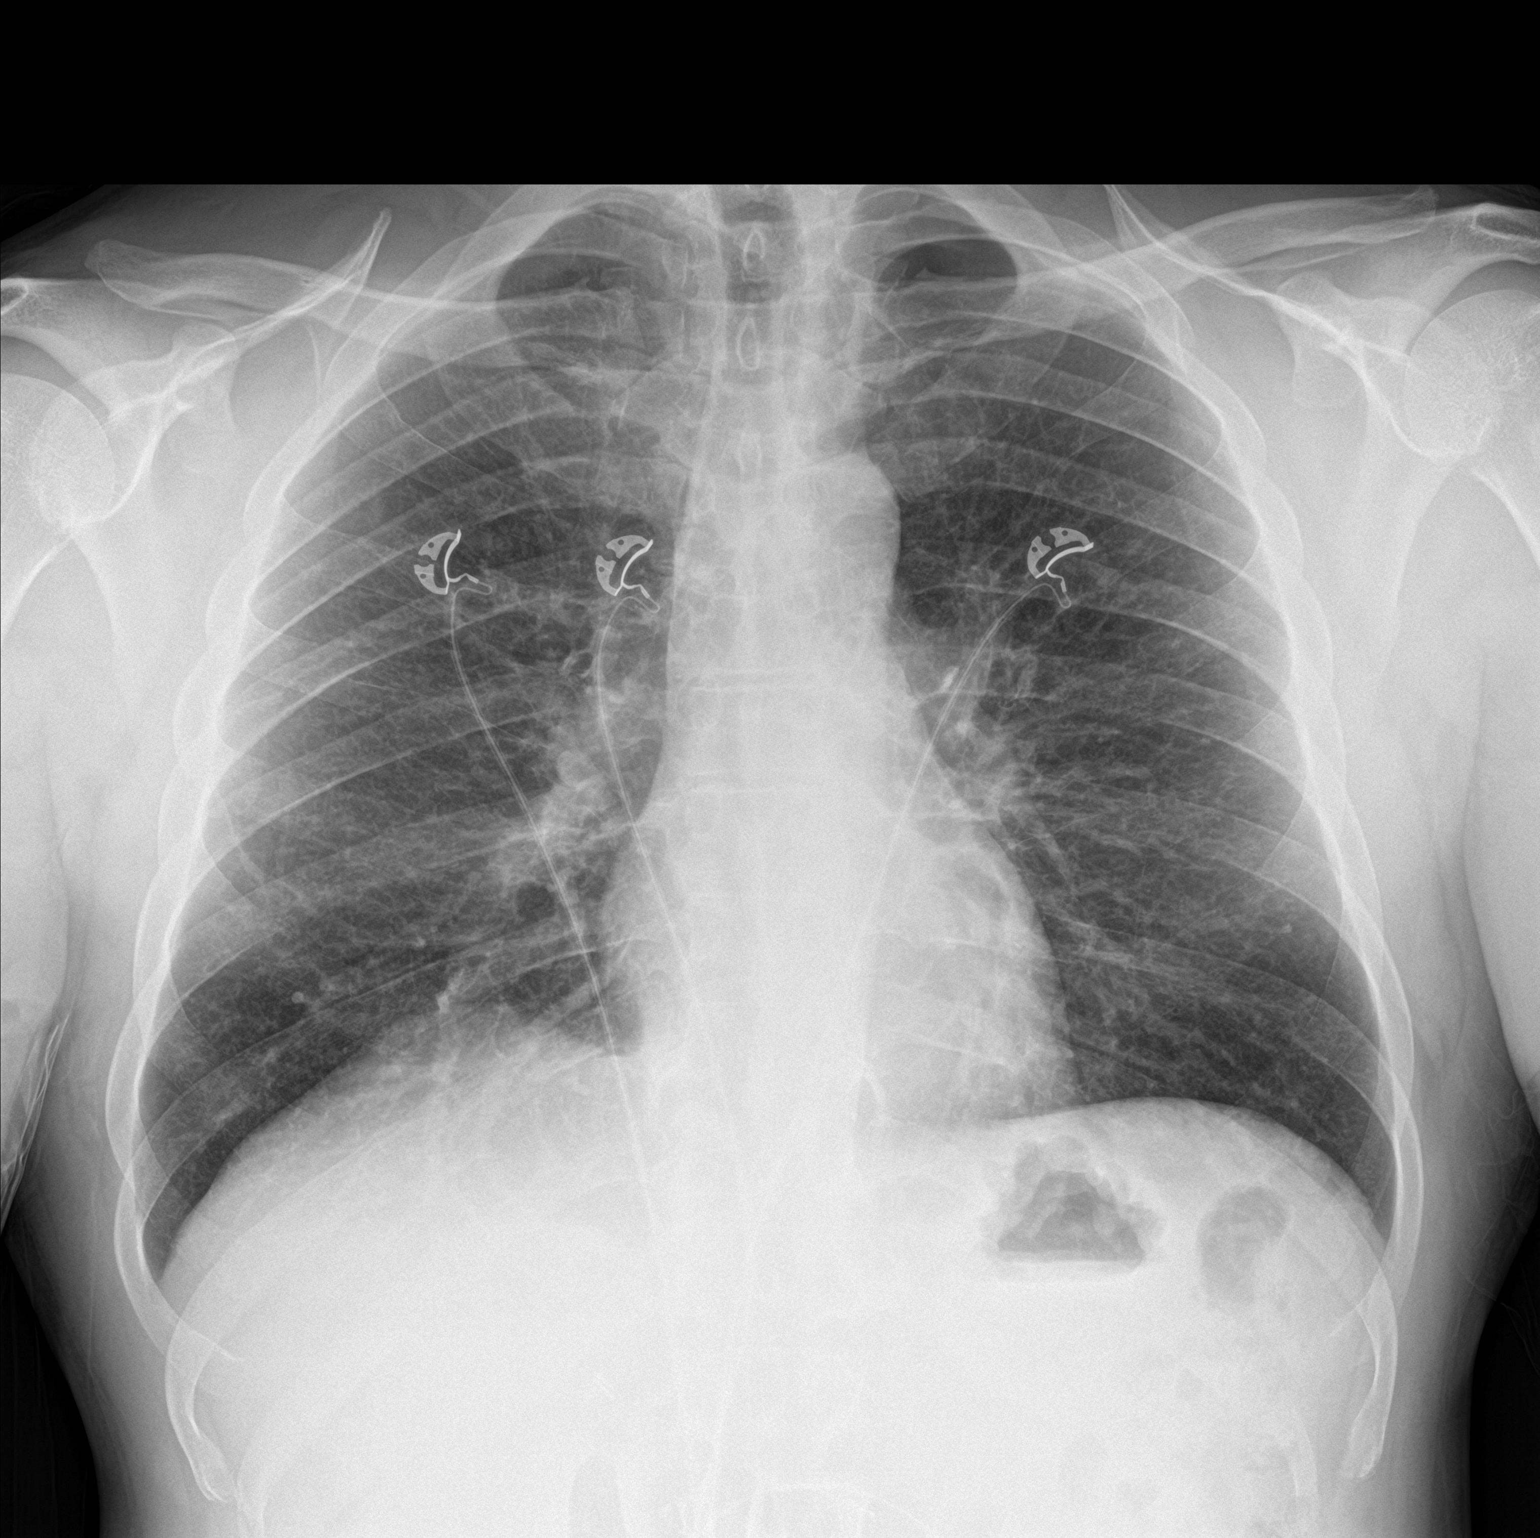

[chest lat]
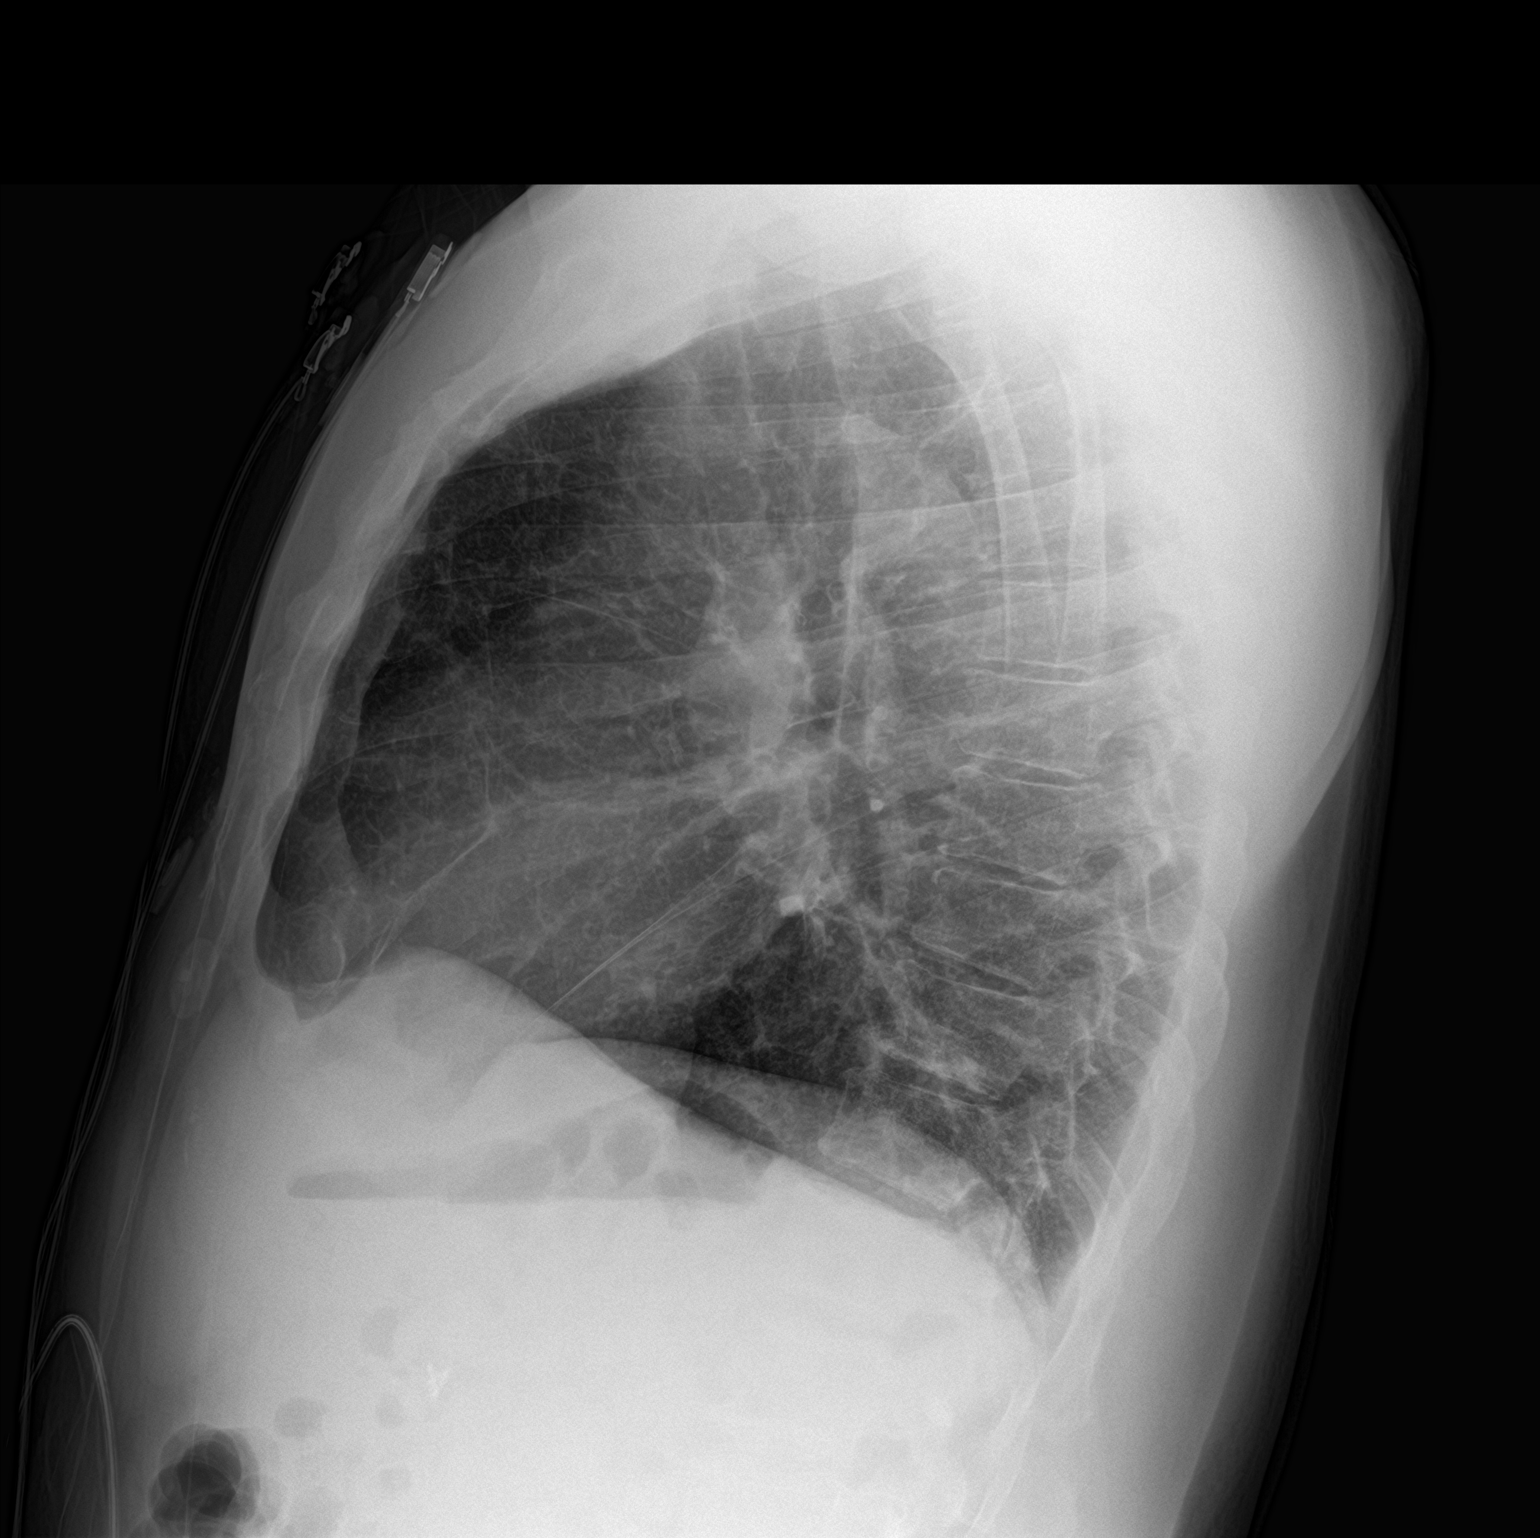

[2 of 2 positions shown; findings below may reference images not displayed]

FINDINGS: There is no appreciable edema or consolidation. The heart size and
pulmonary vascularity are normal. No adenopathy. There is evidence
of old trauma involving the lateral right clavicle with stable
acromioclavicular separation on the right.
IMPRESSION: No edema or consolidation. Old trauma lateral right clavicle with
acromioclavicular separation on the right.

## 2019-08-26 ENCOUNTER — Emergency Department (HOSPITAL_COMMUNITY): Payer: BC Managed Care – PPO

## 2019-08-26 ENCOUNTER — Emergency Department (HOSPITAL_COMMUNITY)
Admission: EM | Admit: 2019-08-26 | Discharge: 2019-08-26 | Disposition: A | Discharge status: Home / Self Care | Payer: BC Managed Care – PPO | Attending: Emergency Medicine | Admitting: Emergency Medicine

## 2019-08-26 ENCOUNTER — Other Ambulatory Visit: Payer: Self-pay

## 2019-08-26 DIAGNOSIS — F17210 Nicotine dependence, cigarettes, uncomplicated: Secondary | ICD-10-CM | POA: Diagnosis not present

## 2019-08-26 DIAGNOSIS — F1210 Cannabis abuse, uncomplicated: Secondary | ICD-10-CM | POA: Diagnosis not present

## 2019-08-26 DIAGNOSIS — R0789 Other chest pain: Secondary | ICD-10-CM | POA: Diagnosis not present

## 2019-08-26 DIAGNOSIS — R0602 Shortness of breath: Secondary | ICD-10-CM | POA: Diagnosis present

## 2019-08-26 DIAGNOSIS — J441 Chronic obstructive pulmonary disease with (acute) exacerbation: Secondary | ICD-10-CM

## 2019-08-26 DIAGNOSIS — Z79899 Other long term (current) drug therapy: Secondary | ICD-10-CM | POA: Insufficient documentation

## 2019-08-26 DIAGNOSIS — R05 Cough: Secondary | ICD-10-CM | POA: Insufficient documentation

## 2019-08-26 DIAGNOSIS — Z20828 Contact with and (suspected) exposure to other viral communicable diseases: Secondary | ICD-10-CM | POA: Insufficient documentation

## 2019-08-26 LAB — COMPREHENSIVE METABOLIC PANEL
ALT: 53 U/L — ABNORMAL HIGH (ref 0–44)
AST: 32 U/L (ref 15–41)
Albumin: 3.8 g/dL (ref 3.5–5.0)
Alkaline Phosphatase: 85 U/L (ref 38–126)
Anion gap: 9 (ref 5–15)
BUN: 6 mg/dL (ref 6–20)
CO2: 23 mmol/L (ref 22–32)
Calcium: 9.2 mg/dL (ref 8.9–10.3)
Chloride: 107 mmol/L (ref 98–111)
Creatinine, Ser: 1.05 mg/dL (ref 0.61–1.24)
GFR calc Af Amer: >60 mL/min (ref >60)
GFR calc non Af Amer: >60 mL/min (ref >60)
Glucose, Bld: 94 mg/dL (ref 70–99)
Potassium: 3.7 mmol/L (ref 3.5–5.1)
Sodium: 139 mmol/L (ref 135–145)
Total Bilirubin: 1 mg/dL (ref 0.3–1.2)
Total Protein: 7.2 g/dL (ref 6.5–8.1)

## 2019-08-26 LAB — CBC WITH DIFFERENTIAL/PLATELET
Abs Immature Granulocytes: 0.02 10*3/uL (ref 0.00–0.07)
Basophils Absolute: 0 10*3/uL (ref 0.0–0.1)
Basophils Relative: 1 %
Eosinophils Absolute: 0.4 10*3/uL (ref 0.0–0.5)
Eosinophils Relative: 8 %
HCT: 41.6 % (ref 39.0–52.0)
Hemoglobin: 14.9 g/dL (ref 13.0–17.0)
Immature Granulocytes: 0 %
Lymphocytes Relative: 21 %
Lymphs Abs: 1 10*3/uL (ref 0.7–4.0)
MCH: 32.7 pg (ref 26.0–34.0)
MCHC: 35.8 g/dL (ref 30.0–36.0)
MCV: 91.2 fL (ref 80.0–100.0)
Monocytes Absolute: 0.4 10*3/uL (ref 0.1–1.0)
Monocytes Relative: 8 %
Neutro Abs: 2.9 10*3/uL (ref 1.7–7.7)
Neutrophils Relative %: 62 %
Platelets: 244 10*3/uL (ref 150–400)
RBC: 4.56 MIL/uL (ref 4.22–5.81)
RDW: 14.2 % (ref 11.5–15.5)
WBC: 4.7 10*3/uL (ref 4.0–10.5)
nRBC: 0 % (ref 0.0–0.2)

## 2019-08-26 LAB — TROPONIN I (HIGH SENSITIVITY)
Troponin I (High Sensitivity): 7 ng/L (ref <18)
Troponin I (High Sensitivity): 7 ng/L (ref <18)

## 2019-08-26 LAB — SARS CORONAVIRUS 2 BY RT PCR (HOSPITAL ORDER, PERFORMED IN ~~LOC~~ HOSPITAL LAB): SARS Coronavirus 2: NEGATIVE

## 2019-08-26 MED ORDER — ALBUTEROL SULFATE HFA 108 (90 BASE) MCG/ACT IN AERS
2.0000 | INHALATION_SPRAY | Freq: Once | RESPIRATORY_TRACT | Status: AC
  Administered 2019-08-26: 2 via RESPIRATORY_TRACT
  Filled 2019-08-26: qty 6.7

## 2019-08-26 MED ORDER — PREDNISONE 10 MG PO TABS: 20.0000 mg | ORAL_TABLET | Freq: Every day | ORAL | 0 refills | Status: DC

## 2019-08-26 MED ORDER — ALBUTEROL SULFATE HFA 108 (90 BASE) MCG/ACT IN AERS: 1.0000 | INHALATION_SPRAY | Freq: Four times a day (QID) | RESPIRATORY_TRACT | 1 refills | Status: DC | PRN

## 2019-08-26 MED ORDER — AZITHROMYCIN 250 MG PO TABS: 250.0000 mg | ORAL_TABLET | Freq: Every day | ORAL | 0 refills | Status: DC

## 2019-08-26 MED ORDER — METHYLPREDNISOLONE SODIUM SUCC 125 MG IJ SOLR
125.0000 mg | Freq: Once | INTRAMUSCULAR | Status: AC
  Administered 2019-08-26: 16:00:00 125 mg via INTRAVENOUS
  Filled 2019-08-26: qty 2

## 2019-08-26 NOTE — ED Triage Notes (Signed)
Pt reports SOB since Sunday, chest pain since yesterday (worsens with deep breath, coughing) Hx COPD, HTN Fire reported 70% on RA, started on nonrebreather, sat 99-100% on RA for EMS Albuterol used about 104min ago Pt reports productive cough, clear sputum

## 2019-08-26 NOTE — Discharge Instructions (Signed)
Please return for any problem.  Follow-up with your regular care provider as instructed. °

## 2019-08-26 NOTE — ED Provider Notes (Signed)
[facility] EMERGENCY DEPARTMENT Provider Note   CSN: [id number] Arrival date & time: 08/26/19  1526     History   Chief Complaint Chief Complaint  Patient presents with  . Shortness of Breath  . Chest Pain    HPI [name] is a 53 y.o. male.     53 year old male with prior medical history as detailed below presents for evaluation of shortness of breath and wheezing.  Patient reports longstanding history of COPD.  He reports that over the last 2 to 3 days he is experiencing increased wheezing and feeling winded.  He denies associated fever.  He denies known contact COVID positive patient.  Patient reports feeling like his chest is tight.  He reports using albuterol at home via [facility] without improvement in his symptoms.  He is speaking in full sentences.  He does not appear to be in acute distress.  The history is provided by the patient, medical records and the EMS personnel.  Shortness of Breath Severity:  Mild Onset quality:  Gradual Duration:  3 days Timing:  Constant Progression:  Waxing and waning Chronicity:  Recurrent Worsened by:  Nothing Ineffective treatments:  Inhaler Associated symptoms: chest pain and cough   Associated symptoms: no fever   Chest Pain Associated symptoms: cough and shortness of breath   Associated symptoms: no fever     Past Medical History:  Diagnosis Date  . COPD (chronic obstructive pulmonary disease) ([facility])   . Migraine   . Migraine with status migrainosus 02/26/2013  . Pneumonia     Patient Active Problem List   Diagnosis Date Noted  . Migraine with status migrainosus 02/26/2013    Past Surgical History:  Procedure Laterality Date  . CHOLECYSTECTOMY  2013  . FINGER SURGERY    . ROTATOR CUFF REPAIR Right 2013  . STOMACH SURGERY          Home Medications    Prior to Admission medications   Medication Sig Start Date End Date Taking? Authorizing Provider  albuterol (PROVENTIL HFA;VENTOLIN HFA)  108 (90 Base) MCG/ACT inhaler Inhale 1-2 puffs into the lungs every 6 (six) hours as needed for wheezing or shortness of breath. 11/02/18   Loura Halt A, NP  amoxicillin-clavulanate (AUGMENTIN) 875-125 MG tablet Take 1 tablet by mouth every 12 (twelve) hours. Patient not taking: Reported on 12/02/2018 11/11/18   Hedges, Dellis Filbert, PA-C  benzonatate (TESSALON) 100 MG capsule Take 1 capsule (100 mg total) by mouth every 8 (eight) hours. Patient taking differently: Take 100 mg by mouth every 8 (eight) hours as needed for cough.  11/11/18   Hedges, Dellis Filbert, PA-C  meloxicam (MOBIC) 7.5 MG tablet Take 7.5 mg by mouth 2 (two) times daily as needed for pain. 06/08/19   [provider]  SYMBICORT 80-4.5 MCG/ACT inhaler Inhale 2 puffs into the lungs 2 (two) times daily. MORNING & EVENING 06/14/19   [provider]    Family History Family History  Problem Relation Age of Onset  . Hypertension Mother   . Tuberculosis Mother   . Diabetes Mother     Social History Social History   Tobacco Use  . Smoking status: Current Some Day Smoker    Packs/day: 1.00    Types: Cigarettes  . Smokeless tobacco: Never Used  Substance Use Topics  . Alcohol use: No  . Drug use: Yes    Types: Marijuana     Allergies   Patient has no known allergies.   Review of Systems Review  of Systems  Constitutional: Negative for fever.  Respiratory: Positive for cough and shortness of breath.   Cardiovascular: Positive for chest pain.  All other systems reviewed and are negative.    Physical Exam Updated Vital Signs BP (!) 139/94 (BP Location: Right Arm)   Pulse 71   Temp 98.4 F (36.9 C) (Oral)   Resp 17   SpO2 99%   Physical Exam Vitals signs and nursing note reviewed.  Constitutional:      General: He is not in acute distress.    Appearance: He is well-developed.  HENT:     Head: Normocephalic and atraumatic.  Eyes:     Conjunctiva/sclera: Conjunctivae normal.     Pupils: Pupils are  equal, round, and reactive to light.  Neck:     Musculoskeletal: Normal range of motion and neck supple.  Cardiovascular:     Rate and Rhythm: Normal rate and regular rhythm.     Heart sounds: Normal heart sounds.  Pulmonary:     Effort: Pulmonary effort is normal. No respiratory distress.     Comments: Diffuse trace expiratory wheezes in all lung fields Abdominal:     General: There is no distension.     Palpations: Abdomen is soft.     Tenderness: There is no abdominal tenderness.  Musculoskeletal: Normal range of motion.        General: No deformity.  Skin:    General: Skin is warm and dry.  Neurological:     Mental Status: He is alert and oriented to person, place, and time.      ED Treatments / Results  Labs (all labs ordered are listed, but only abnormal results are displayed) Labs Reviewed  COMPREHENSIVE METABOLIC PANEL - Abnormal; Notable for the following components:      Result Value   ALT 53 (*)    All other components within normal limits  SARS CORONAVIRUS 2 (HOSPITAL ORDER, PERFORMED IN [facility])  CBC WITH DIFFERENTIAL/PLATELET  TROPONIN I (HIGH SENSITIVITY)  TROPONIN I (HIGH SENSITIVITY)    EKG EKG Interpretation  Date/Time:  Wednesday August 26 2019 15:33:39 EDT Ventricular Rate:  69 PR Interval:    QRS Duration: 88 QT Interval:  367 QTC Calculation: 394 R Axis:   78 Text Interpretation:  Sinus rhythm Consider left ventricular hypertrophy Confirmed by Kristine Royal [phone]) on 08/26/2019 3:40:09 PM   Radiology Dg Chest Port 1 View  Result Date: 08/26/2019 CLINICAL DATA:  Productive cough. Chest tightness and shortness of breath. EXAM: PORTABLE CHEST 1 VIEW COMPARISON:  12/02/2018 FINDINGS: Heart and mediastinal shadows are normal. There may be mild central bronchial thickening but there is no infiltrate, collapse or effusion. No significant bone finding. IMPRESSION: Possible bronchitis.  No consolidation or collapse. Electronically  Signed   By: Paulina Fusi M.D.   On: 08/26/2019 16:25    Procedures Procedures (including critical care time)  Medications Ordered in ED Medications  methylPREDNISolone sodium succinate (SOLU-MEDROL) 125 mg/2 mL injection 125 mg (has no administration in time range)  albuterol (VENTOLIN HFA) 108 (90 Base) MCG/ACT inhaler 2 puff (has no administration in time range)     Initial Impression / Assessment and Plan / ED Course  I have reviewed the triage vital signs and the nursing notes.  Pertinent labs & imaging results that were available during my care of the patient were reviewed by me and considered in my medical decision making (see chart for details).        MDM  Screen  complete  [name] was evaluated in Emergency Department on 08/26/2019 for the symptoms described in the history of present illness. He was evaluated in the context of the global COVID-19 pandemic, which necessitated consideration that the patient might be at risk for infection with the SARS-CoV-2 virus that causes COVID-19. Institutional protocols and algorithms that pertain to the evaluation of patients at risk for COVID-19 are in a state of rapid change based on information released by regulatory bodies including the CDC and federal and state organizations. These policies and algorithms were followed during the patient's care in the ED.  Patient is presenting for evaluation of reported shortness of breath with associated atypical chest discomfort.  Symptoms described are most consistent with likely COPD exacerbation.  Screening labs obtained in the ED do not demonstrate evidence of significant pneumonia, cardiac dysfunction, COVID infection, or other acute pathology.  Patient feels improved following his ED evaluation.    Patient now desires discharge.  Patient does understand need for close follow-up.  Strict return precautions given and understood.  Final Clinical Impressions(s) / ED Diagnoses    Final diagnoses:  COPD exacerbation [facility])    ED Discharge Orders         Ordered    predniSONE (DELTASONE) 10 MG tablet  Daily     08/26/19 2006    azithromycin (ZITHROMAX) 250 MG tablet  Daily     08/26/19 2006    albuterol (VENTOLIN HFA) 108 (90 Base) MCG/ACT inhaler  Every 6 hours PRN     08/26/19 2006           Wynetta Fines, MD 08/26/19 2007

## 2019-10-20 ENCOUNTER — Emergency Department (HOSPITAL_COMMUNITY)
Admission: EM | Admit: 2019-10-20 | Discharge: 2019-10-21 | Disposition: A | Discharge status: Home / Self Care | Payer: BC Managed Care – PPO | Attending: Emergency Medicine | Admitting: Emergency Medicine

## 2019-10-20 ENCOUNTER — Emergency Department (HOSPITAL_COMMUNITY): Payer: BC Managed Care – PPO

## 2019-10-20 ENCOUNTER — Other Ambulatory Visit: Payer: Self-pay

## 2019-10-20 DIAGNOSIS — F17210 Nicotine dependence, cigarettes, uncomplicated: Secondary | ICD-10-CM | POA: Insufficient documentation

## 2019-10-20 DIAGNOSIS — Z79899 Other long term (current) drug therapy: Secondary | ICD-10-CM | POA: Diagnosis not present

## 2019-10-20 DIAGNOSIS — J441 Chronic obstructive pulmonary disease with (acute) exacerbation: Secondary | ICD-10-CM | POA: Insufficient documentation

## 2019-10-20 DIAGNOSIS — R0789 Other chest pain: Secondary | ICD-10-CM | POA: Diagnosis present

## 2019-10-20 LAB — CBC
HCT: 42.2 % (ref 39.0–52.0)
Hemoglobin: 14.5 g/dL (ref 13.0–17.0)
MCH: 30.9 pg (ref 26.0–34.0)
MCHC: 34.4 g/dL (ref 30.0–36.0)
MCV: 89.8 fL (ref 80.0–100.0)
Platelets: 293 10*3/uL (ref 150–400)
RBC: 4.7 MIL/uL (ref 4.22–5.81)
RDW: 13.4 % (ref 11.5–15.5)
WBC: 5.1 10*3/uL (ref 4.0–10.5)
nRBC: 0 % (ref 0.0–0.2)

## 2019-10-20 LAB — BASIC METABOLIC PANEL
Anion gap: 10 (ref 5–15)
BUN: 9 mg/dL (ref 6–20)
CO2: 21 mmol/L — ABNORMAL LOW (ref 22–32)
Calcium: 9.3 mg/dL (ref 8.9–10.3)
Chloride: 104 mmol/L (ref 98–111)
Creatinine, Ser: 1.15 mg/dL (ref 0.61–1.24)
GFR calc Af Amer: >60 mL/min (ref >60)
GFR calc non Af Amer: >60 mL/min (ref >60)
Glucose, Bld: 144 mg/dL — ABNORMAL HIGH (ref 70–99)
Potassium: 3.6 mmol/L (ref 3.5–5.1)
Sodium: 135 mmol/L (ref 135–145)

## 2019-10-20 LAB — TROPONIN I (HIGH SENSITIVITY)
Troponin I (High Sensitivity): 6 ng/L (ref <18)
Troponin I (High Sensitivity): 6 ng/L (ref <18)

## 2019-10-20 MED ORDER — ALBUTEROL SULFATE HFA 108 (90 BASE) MCG/ACT IN AERS
2.0000 | INHALATION_SPRAY | Freq: Once | RESPIRATORY_TRACT | Status: AC
  Administered 2019-10-20: 2 via RESPIRATORY_TRACT
  Filled 2019-10-20: qty 6.7

## 2019-10-20 MED ORDER — SODIUM CHLORIDE 0.9% FLUSH: 3.0000 mL | Freq: Once | INTRAVENOUS | Status: DC

## 2019-10-20 NOTE — ED Triage Notes (Signed)
Pt here for shob d/t COPD and chest pain from coughing (usual cough) x 3 days. Pt has a PCP but sts he doesn't have equipment at home to have oxygen or breathing tx so he has to keep coming back to the ER. Pt also has a swollen thumb and states a lot of puss came out of it yesterday.

## 2019-10-21 MED ORDER — ALBUTEROL SULFATE HFA 108 (90 BASE) MCG/ACT IN AERS
8.0000 | INHALATION_SPRAY | RESPIRATORY_TRACT | Status: DC | PRN
  Administered 2019-10-21: 8 via RESPIRATORY_TRACT
  Filled 2019-10-21: qty 6.7

## 2019-10-21 MED ORDER — PREDNISONE 20 MG PO TABS
60.0000 mg | ORAL_TABLET | Freq: Once | ORAL | Status: AC
  Administered 2019-10-21: 60 mg via ORAL
  Filled 2019-10-21: qty 3

## 2019-10-21 MED ORDER — IPRATROPIUM BROMIDE HFA 17 MCG/ACT IN AERS
2.0000 | INHALATION_SPRAY | Freq: Once | RESPIRATORY_TRACT | Status: AC
  Administered 2019-10-21: 2 via RESPIRATORY_TRACT
  Filled 2019-10-21: qty 12.9

## 2019-10-21 MED ORDER — PREDNISONE 20 MG PO TABS: 60.0000 mg | ORAL_TABLET | Freq: Every day | ORAL | 0 refills | Status: AC

## 2019-10-21 NOTE — Discharge Instructions (Addendum)
Use the two inhalers, and take prednisone as prescribed for current COPD flare. Return to the emergency department with any high fever, severe pain or shortness of breath, or for new concern. Otherwise, please follow up with your doctor regarding obtaining your scheduled nebulizer machine for home use.

## 2019-10-21 NOTE — ED Provider Notes (Signed)
[facility] EMERGENCY DEPARTMENT Provider Note   CSN: [id number] Arrival date & time: 10/20/19  1702     History   Chief Complaint Chief Complaint  Patient presents with  . Shortness of Breath    HPI [name] is a 53 y.o. male.     Patient with history COPD, former smoker, presents with wheezing for the past 3 days, uncontrolled with his inhaler. No fever. He reports bilateral chest tightness. No congestion, sore throat, nausea or vomiting. He states he is supposed to be getting a nebulizer machine through his PCP but this has not happened yet. He ran out of his inhaler today.   The history is provided by the patient. No language interpreter was used.  Shortness of Breath Associated symptoms: cough and wheezing   Associated symptoms: no abdominal pain, no fever and no sore throat     Past Medical History:  Diagnosis Date  . COPD (chronic obstructive pulmonary disease) (HCC)   . Migraine   . Migraine with status migrainosus 02/26/2013  . Pneumonia     Patient Active Problem List   Diagnosis Date Noted  . Migraine with status migrainosus 02/26/2013    Past Surgical History:  Procedure Laterality Date  . CHOLECYSTECTOMY  2013  . FINGER SURGERY    . ROTATOR CUFF REPAIR Right 2013  . STOMACH SURGERY          Home Medications    Prior to Admission medications   Medication Sig Start Date End Date Taking? Authorizing Provider  albuterol (PROVENTIL HFA;VENTOLIN HFA) 108 (90 Base) MCG/ACT inhaler Inhale 1-2 puffs into the lungs every 6 (six) hours as needed for wheezing or shortness of breath. 11/02/18   Bast, Gloris Manchesterraci A, NP  albuterol (VENTOLIN HFA) 108 (90 Base) MCG/ACT inhaler Inhale 1-2 puffs into the lungs every 6 (six) hours as needed for wheezing or shortness of breath. 08/26/19   Wynetta FinesMessick, Peter C, MD  amoxicillin-clavulanate (AUGMENTIN) 875-125 MG tablet Take 1 tablet by mouth every 12 (twelve) hours. Patient not taking: Reported on  08/26/2019 11/11/18   Hedges, Tinnie GensJeffrey, PA-C  azithromycin (ZITHROMAX) 250 MG tablet Take 1 tablet (250 mg total) by mouth daily. Take first 2 tablets together, then 1 every day until finished. 08/26/19   Wynetta FinesMessick, Peter C, MD  benzonatate (TESSALON) 100 MG capsule Take 1 capsule (100 mg total) by mouth every 8 (eight) hours. Patient not taking: Reported on 08/26/2019 11/11/18   Hedges, Tinnie GensJeffrey, PA-C  meloxicam (MOBIC) 7.5 MG tablet Take 7.5 mg by mouth 2 (two) times daily as needed for pain. 06/08/19   [provider]  predniSONE (DELTASONE) 10 MG tablet Take 2 tablets (20 mg total) by mouth daily. 08/26/19   Wynetta FinesMessick, Peter C, MD  Pseudoeph-CPM-DM-APAP (THERAFLU FLU/CONGESTION MAX ST PO) Take 1 packet by mouth every 6 (six) hours as needed (for congestion, cough, and/or malaise (MIX & DRINK)).     [provider]  SYMBICORT 80-4.5 MCG/ACT inhaler Inhale 2 puffs into the lungs 2 (two) times daily. MORNING & EVENING 06/14/19   [provider]    Family History Family History  Problem Relation Age of Onset  . Hypertension Mother   . Tuberculosis Mother   . Diabetes Mother     Social History Social History   Tobacco Use  . Smoking status: Current Some Day Smoker    Packs/day: 1.00    Types: Cigarettes  . Smokeless tobacco: Never Used  Substance Use Topics  . Alcohol use: No  .  Drug use: Yes    Types: Marijuana     Allergies   Patient has no known allergies.   Review of Systems Review of Systems  Constitutional: Negative for chills and fever.  HENT: Negative.  Negative for congestion and sore throat.   Respiratory: Positive for cough, chest tightness, shortness of breath and wheezing.   Cardiovascular: Negative.   Gastrointestinal: Negative.  Negative for abdominal pain and nausea.  Musculoskeletal: Negative.  Negative for myalgias.  Skin: Negative.   Neurological: Negative.  Negative for light-headedness.     Physical Exam Updated Vital Signs BP (!)  140/118 (BP Location: Right Arm)   Pulse 78   Temp 98 F (36.7 C) (Oral)   Resp 18   SpO2 97%   Physical Exam Vitals signs and nursing note reviewed.  Constitutional:      Appearance: He is well-developed.  HENT:     Head: Normocephalic.  Neck:     Musculoskeletal: Normal range of motion and neck supple.  Cardiovascular:     Rate and Rhythm: Normal rate and regular rhythm.     Heart sounds: No murmur.  Pulmonary:     Effort: Pulmonary effort is normal.     Breath sounds: Wheezing present. No rhonchi or rales.     Comments: Inspiratory and expiratory wheezes bilaterally. Prolonged expirations. Chest:     Chest wall: No tenderness.  Abdominal:     General: Bowel sounds are normal.     Palpations: Abdomen is soft.     Tenderness: There is no abdominal tenderness. There is no guarding or rebound.  Musculoskeletal: Normal range of motion.  Skin:    General: Skin is warm and dry.  Neurological:     Mental Status: He is alert and oriented to person, place, and time.      ED Treatments / Results  Labs (all labs ordered are listed, but only abnormal results are displayed) Labs Reviewed  BASIC METABOLIC PANEL - Abnormal; Notable for the following components:      Result Value   CO2 21 (*)    Glucose, Bld 144 (*)    All other components within normal limits  CBC  TROPONIN I (HIGH SENSITIVITY)  TROPONIN I (HIGH SENSITIVITY)    EKG None  Radiology Dg Chest 2 View  Result Date: 10/20/2019 CLINICAL DATA:  Shortness of breath EXAM: CHEST - 2 VIEW COMPARISON:  08/26/2019, 11/26/2018 FINDINGS: The heart size and mediastinal contours are within normal limits. Both lungs are clear. Chronic widening of right AC joint. IMPRESSION: No active cardiopulmonary disease. Electronically Signed   By: Jasmine Pang M.D.   On: 10/20/2019 18:00    Procedures Procedures (including critical care time)  Medications Ordered in ED Medications  sodium chloride flush (NS) 0.9 % injection 3  mL (3 mLs Intravenous Not Given 10/21/19 0020)  albuterol (VENTOLIN HFA) 108 (90 Base) MCG/ACT inhaler 8 puff (has no administration in time range)  predniSONE (DELTASONE) tablet 60 mg (has no administration in time range)  ipratropium (ATROVENT HFA) inhaler 2 puff (has no administration in time range)  albuterol (VENTOLIN HFA) 108 (90 Base) MCG/ACT inhaler 2 puff (2 puffs Inhalation Given 10/20/19 2215)     Initial Impression / Assessment and Plan / ED Course  I have reviewed the triage vital signs and the nursing notes.  Pertinent labs & imaging results that were available during my care of the patient were reviewed by me and considered in my medical decision making (see chart for details).  [name] was evaluated in the Emergency Department on 10/21/19 for the symptoms described in the history of present illness. He was evaluated in the context of the global COVID-19 pandemic, which necessitated consideration that the patient might be at risk for infection with the SARS-CoV-2 virus that causes COVID-19. Institutional protocols and algorithms that pertain to the evaluation of patients at risk for COVID-19 are in a state of rapid change based on information released by regulatory bodies including the CDC and federal and state organizations. These policies and algorithms were followed during the patient's care in the ED.         Patient with COPD here with exacerbation, out of inhaler today, but reports decreasing efficacy while using it during the past 2 days. No fever.   He was given 2 puffs of albuterol on arrival with some but minimal relief. No hypoxia. Will give 8 puffs albuterol, 2 puffs Atrovent, prednisone and recheck for improvement.   1:45- on recheck the patient is breathing much easier. He reports significant improvement. He still has wheezing in the bilateral bases, but is improved.   He reports he is comfortable for discharge home. Albuterol and Atrovent inhalers  provided, along with Rx prednisone.   Final Clinical Impressions(s) / ED Diagnoses   Final diagnoses:  None   1. COPD exacerbation  ED Discharge Orders    None       Charlann Lange, PA-C 10/21/19 0154    Fatima Blank, MD 10/21/19 [phone]

## 2019-11-18 ENCOUNTER — Encounter: Payer: Self-pay | Admitting: Pulmonary Disease

## 2019-11-18 ENCOUNTER — Other Ambulatory Visit: Payer: Self-pay

## 2019-11-18 ENCOUNTER — Ambulatory Visit: Payer: BC Managed Care – PPO | Admitting: Pulmonary Disease

## 2019-11-18 VITALS — BP 110/78 | HR 68 | Temp 98.3°F | Ht 69.0 in | Wt 220.0 lb

## 2019-11-18 DIAGNOSIS — J441 Chronic obstructive pulmonary disease with (acute) exacerbation: Secondary | ICD-10-CM | POA: Diagnosis not present

## 2019-11-18 DIAGNOSIS — J449 Chronic obstructive pulmonary disease, unspecified: Secondary | ICD-10-CM

## 2019-11-18 DIAGNOSIS — Z720 Tobacco use: Secondary | ICD-10-CM

## 2019-11-18 DIAGNOSIS — Z87891 Personal history of nicotine dependence: Secondary | ICD-10-CM | POA: Insufficient documentation

## 2019-11-18 MED ORDER — BREZTRI AEROSPHERE 160-9-4.8 MCG/ACT IN AERO: 2.0000 | INHALATION_SPRAY | Freq: Two times a day (BID) | RESPIRATORY_TRACT | 5 refills | Status: DC

## 2019-11-18 MED ORDER — ALBUTEROL SULFATE HFA 108 (90 BASE) MCG/ACT IN AERS: 2.0000 | INHALATION_SPRAY | Freq: Four times a day (QID) | RESPIRATORY_TRACT | 3 refills | Status: DC | PRN

## 2019-11-18 MED ORDER — PREDNISONE 20 MG PO TABS: 20.0000 mg | ORAL_TABLET | Freq: Every day | ORAL | 0 refills | Status: AC

## 2019-11-18 NOTE — Patient Instructions (Addendum)
COPD Exacerbation START prednisone 40 mg daily Will refer patient to [facility] Team for Inhaler Education/Optimization  Patient inhaler regimen should include ICS+LABA+LAMAAlso consider nebulizer options. Please schedule appointment on [facility] pharmacist schedule in the next two weeks.    Tobacco Abuse Congratulations on quitting! As you know, smoking can worsen your lung function and your ability to breathe so please let us know how we can help to continue you off cigarettes.  Follow-up with me in 3 weeks

## 2019-11-18 NOTE — Progress Notes (Signed)
Subjective:   PATIENT ID: [name] GENDER: male DOB: 1966-02-09, MRN: [id number]   HPI  Chief Complaint  Patient presents with  . Consult    COPD, SOB, Wheezing, x 1 year, no symtom relief, cough, mucus clear    Reason for Visit: New consult for COPD  [name] is a 53 year old male with significant tobacco history and presumed COPD who presents as a new consult for COPD management.  In the last year, he has had multiple visits to the ED for COPD/breathing related issues. He has insurance but due to recently financial difficulties he is unable to afford his medications. At this time, he only uses albuterol which he will take 2 to 4 puffs 3-4 times a day for wheezing, nonproductive cough and shortness of breath. His symptoms are chronic and will intermittently worsen with weather changes and activity. There are days where he can only walk 10 feet before feeling short of breath but most days 100 feet is his max.  Rest improves his symptoms.  Denies chest pain.  His physical limitations related to his breathing have made it difficult for him to work and he is currently on short-term disability.  He quit smoking cold [place] last week.  His parents and siblings all have COPD and were heavy smokers while growing up. He started smoking when he was 53 years old and began smoking 2 packs/day as an adult.  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 Previously smoked 2ppd. Started when he was 52 years old Multiple family members with COPD, also smokers  I have personally reviewed patient's past medical/family/social history, allergies, current medications.  Past Medical History:  Diagnosis Date  . COPD (chronic obstructive pulmonary disease) ([place])   . Migraine   . Migraine with status migrainosus 02/26/2013  . Pneumonia      Family History  Problem Relation Age of Onset  . Hypertension Mother   . Tuberculosis Mother   . Diabetes Mother   . COPD Mother    . COPD Father   . Cancer Sister   . COPD Brother   . Heart attack Brother      Social History   Occupational History    Employer: [employer]    Comment: heavey lifting 60-200LB since 2007  Tobacco Use  . Smoking status: Former Smoker    Packs/day: 2.00    Years: 42.00    Pack years: 84.00    Types: Cigarettes    Quit date: 11/11/2019    Years since quitting: 0.0  . Smokeless tobacco: Never Used  . Tobacco comment: quit days ago  Substance and Sexual Activity  . Alcohol use: No  . Drug use: Yes    Types: Marijuana    Comment: last one week ago  . Sexual activity: Not on file    No Known Allergies   Outpatient Medications Prior to Visit  Medication Sig Dispense Refill  . albuterol (PROVENTIL HFA;VENTOLIN HFA) 108 (90 Base) MCG/ACT inhaler Inhale 1-2 puffs into the lungs every 6 (six) hours as needed for wheezing or shortness of breath. (Patient not taking: Reported on 11/18/2019) 1 Inhaler 0  . albuterol (VENTOLIN HFA) 108 (90 Base) MCG/ACT inhaler Inhale 1-2 puffs into the lungs every 6 (six) hours as needed for wheezing or shortness of breath. (Patient not taking: Reported on 10/21/2019) 108 g 1  . amoxicillin-clavulanate (AUGMENTIN) 875-125 MG tablet Take 1 tablet by mouth every 12 (twelve) hours. (Patient not taking: Reported on 08/26/2019)  14 tablet 0  . azithromycin (ZITHROMAX) 250 MG tablet Take 1 tablet (250 mg total) by mouth daily. Take first 2 tablets together, then 1 every day until finished. (Patient not taking: Reported on 10/21/2019) 6 tablet 0  . benzonatate (TESSALON) 100 MG capsule Take 1 capsule (100 mg total) by mouth every 8 (eight) hours. (Patient not taking: Reported on 08/26/2019) 21 capsule 0   No facility-administered medications prior to visit.   Review of Systems  Constitutional: Negative for chills, diaphoresis, fever, malaise/fatigue and weight loss.  HENT: Negative for congestion, ear pain and sore throat.   Respiratory: Positive for cough, sputum  production, shortness of breath and wheezing. Negative for hemoptysis.   Cardiovascular: Positive for palpitations. Negative for chest pain and leg swelling.  Gastrointestinal: Positive for heartburn and nausea. Negative for abdominal pain.  Genitourinary: Positive for frequency.  Musculoskeletal: Positive for myalgias. Negative for joint pain.  Skin: Negative for itching and rash.  Neurological: Positive for weakness and headaches. Negative for dizziness.  Endo/Heme/Allergies: Does not bruise/bleed easily.  Psychiatric/Behavioral: Positive for depression. The patient is not nervous/anxious.     Objective:   Vitals:   11/18/19 0945  BP: 110/78  Pulse: 68  Temp: 98.3 F (36.8 C)  TempSrc: Temporal  SpO2: 96%  Weight: 220 lb (99.8 kg)  Height: 5\' 9"  (1.753 m)   SpO2: 96 % O2 Device: None (Room air)  Physical Exam: General: Well-appearing, no acute distress HENT: [place], AT Eyes: EOMI, no scleral icterus Respiratory: Diminished breath sounds bilaterally.  Scattered expiratory wheezing bilaterally. Cardiovascular: RRR, -M/R/G, no JVD GI: BS+, soft, nontender Extremities:-Edema,-tenderness Neuro: AAO x4, CNII-XII grossly intact Skin: Intact, no rashes or bruising Psych: Normal mood, normal affect  Data Reviewed:  Imaging: CXR 10/20/19 - Increased AP diameter, central peribronchial thickening. No infiltrate, effusion or edema  PFT: None on file  Labs: CBC    Component Value Date/Time   WBC 5.1 10/20/2019 1730   RBC 4.70 10/20/2019 1730   HGB 14.5 10/20/2019 1730   HCT 42.2 10/20/2019 1730   PLT 293 10/20/2019 1730   MCV 89.8 10/20/2019 1730   MCH 30.9 10/20/2019 1730   MCHC 34.4 10/20/2019 1730   RDW 13.4 10/20/2019 1730   LYMPHSABS 1.0 08/26/2019 1541   MONOABS 0.4 08/26/2019 1541   EOSABS 0.4 08/26/2019 1541   BASOSABS 0.0 08/26/2019 1541   Imaging, labs and tests noted above have been reviewed independently by me.    Assessment & Plan:   Discussion:  53 year old male with presumed COPD who presents for COPD management.  He has chronic dyspnea, cough and wheezing at baseline however has had limited activity in the last week due to his symptoms.  Symptoms are uncontrolled off maintenance therapy and requires frequent SABA use.  COPD Exacerbation START prednisone 40 mg daily Will refer patient to [facility] Team for Inhaler Education/Optimization  Patient inhaler regimen should include ICS+LABA+LAMAAlso consider nebulizer options. Please schedule appointment on [facility] pharmacist schedule in the next two weeks.    Tobacco Abuse Congratulations on quitting! As you know, smoking can worsen your lung function and your ability to breathe so please let 44 know how we can help to continue you off cigarettes.   Health Maintenance Immunization History  Administered Date(s) Administered  . Influenza-Unspecified 11/12/2019   CT Lung Screen - not qualified due to age  No orders of the defined types were placed in this encounter.  Meds ordered this encounter  Medications  . predniSONE (DELTASONE) 20 MG  tablet    Sig: Take 1 tablet (20 mg total) by mouth daily with breakfast for 5 days.    Dispense:  10 tablet    Refill:  0   Return in about 3 weeks (around 12/09/2019).  Chi Mechele CollinJane Ellison, MD [facility] 11/18/2019 9:51 AM  Office Number [phone]

## 2019-11-18 NOTE — Progress Notes (Signed)
Subjective Patient presents today to [facility] to see pharmacy team for inhaler optimization and education. He is a new patient to the practice.  Past medical history includes COPD, history of tobacco abuse, and migraines.  He states he has a PCP but can not remember her name.  She practices at an [facility] on [facility].  He is currently on proair daily and frequently visits the ED for breathing treatments.  He states he has only tried albuterol daily and another "round" inhaler in the past which is was unable to afford.  Respiratory medications Current: proair which he used everyday Tried in past: N/A, another "round inhaler that he can not remember the name of" Patient reports adherence challenges due to cost  COPD Questionnaire  CAT ASSESSMENT  Rank each of the following items on a scale of 0 to 5 (with 5 being most severe) Write a # 0-5 in each box  I never cough (0) > I cough all the time (5) 5  I have no phlegm (mucus) in my chest (0) > My chest is completely full of phlegm (mucus) (5) 5  My chest does not feel tight at all (0) > My chest feels very tight (5) 3  When I walk up a hill or one flight of stairs I am not breathless (0) > When I walk up a hill or one flight of stairs I am very breathless (5) 5  I am not limited doing any activities at home (0) > I am very limited doing activities at home (5) 5  I am confident leaving my home despite my lung function (0) > I am not at all confident leaving my home because of my lung condition (5)  5  I sleep soundly (0) > I don't sleep soundly because of my lung condition (5) 4  I have lots of energy (0) > I have no energy at all (5) 4   Total CAT Score: 36  FEV1: none on file  Number of hospitilizations in the last year: 4 Number of COPD exacerbations in the last year: 4  Most recent blood eosinophil count was 0.4 cells/microL taken on 08/26/2019.   Gold Stage: unable to determine without FEV1  Gold  Group: D  Objective No Known Allergies  Outpatient Encounter Medications as of 11/18/2019  Medication Sig  . albuterol (PROVENTIL HFA;VENTOLIN HFA) 108 (90 Base) MCG/ACT inhaler Inhale 1-2 puffs into the lungs every 6 (six) hours as needed for wheezing or shortness of breath. (Patient not taking: Reported on 11/18/2019)  . albuterol (VENTOLIN HFA) 108 (90 Base) MCG/ACT inhaler Inhale 1-2 puffs into the lungs every 6 (six) hours as needed for wheezing or shortness of breath. (Patient not taking: Reported on 10/21/2019)  . predniSONE (DELTASONE) 20 MG tablet Take 1 tablet (20 mg total) by mouth daily with breakfast for 5 days.  . [DISCONTINUED] amoxicillin-clavulanate (AUGMENTIN) 875-125 MG tablet Take 1 tablet by mouth every 12 (twelve) hours. (Patient not taking: Reported on 08/26/2019)  . [DISCONTINUED] azithromycin (ZITHROMAX) 250 MG tablet Take 1 tablet (250 mg total) by mouth daily. Take first 2 tablets together, then 1 every day until finished. (Patient not taking: Reported on 10/21/2019)  . [DISCONTINUED] benzonatate (TESSALON) 100 MG capsule Take 1 capsule (100 mg total) by mouth every 8 (eight) hours. (Patient not taking: Reported on 08/26/2019)   No facility-administered encounter medications on file as of 11/18/2019.     Immunization History  Administered Date(s) Administered  .  Influenza-Unspecified 11/12/2019     Assessment and Plan  1) Inhaler Optimization  Patient has difficulty affording inhalers.  He states a "round" inhaler cost him $150 which he is unable to afford.  Assume he may of had a deductible to meet.  Per Dr. Loanne Drilling patient should be on triple therapy.  He has Pharmacist, community and is eligible to use a co-pay card.  A test claim was processed for both Breztri and Trelegy.  Each have a co-pay of $5 dollars.  Concerned patient may not have enough inspiratory force to use Trelegy effectively.  Inspiratory flow measured using the In-check DIAL G16 and was close  to minimal range of 30 L/min for Trelegy Ellipta device at 30-35.  He would be a better candidate for MDI inhaler Breztri.  2) Inhaler Education  Patient was counseled on the purpose, proper use, and adverse effects of Breztri inhaler.  Instructed patient to rinse mouth with water after using in order to prevent fungal infection.  Patient verbalized understanding.  Reviewed appropriate use of maintenance vs rescue inhalers.  Stressed importance of using maintenance inhaler daily and rescue inhaler only as needed.  Patient verbalized understanding.  Demonstrated proper inhaler technique using Breztri demo inhaler and reviewed instructional video.  Patient able to demonstrate proper inhaler technique using teach back method. Patient was given sample and co-pay card in office today.  His inhaler was primed and able to administer first dose in office without issue.  Sample: Arnell Sieving NDC: 8453-6468-03 Lot: [id number] Expiration: [id number]  Will follow up at next visit in 3 weeks to reassess inhaler technique.  He may benefit from using Breztri with a spacer as well as nebulized medications as needed.  3) Immunizations  He is up to date with his yearly influenza vaccine.  He is candidate for Pneumovax 23.  Will address at next visit.  4) History of tobacco abuse  Patient recently quit smoking last week.  Unable to address in depth at this visit.  Will follow up on progress at next visit.  All questions encouraged and answered.  Instructed patient to call with any questions or concerns.   Mariella Saa, PharmD, [place] [place] [facility] Pharmacist [phone]  11/18/2019 11:37 AM

## 2019-12-15 NOTE — Progress Notes (Deleted)
   Subjective Patient presents today to [facility] to see pharmacy team for COPD follow-up after starting Breztri..  Past medical history includes COPD, history of tobacco abuse, and migraines.  Respiratory medications Current: Ignacia Palma in past: "a round inhaler" suspect a DPI  Patient reports {Adherence challenges yes no:3044014::"adherence challenges","no known adherence challenges"}  COPD Questionnaire  CAT ASSESSMENT  Rank each of the following items on a scale of 0 to 5 (with 5 being most severe) Write a # 0-5 in each box  I never cough (0) > I cough all the time (5) {Numbers; 0-5:140013}  I have no phlegm (mucus) in my chest (0) > My chest is completely full of phlegm (mucus) (5) {Numbers; 0-5:140013}  My chest does not feel tight at all (0) > My chest feels very tight (5) {Numbers; 0-5:140013}  When I walk up a hill or one flight of stairs I am not breathless (0) > When I walk up a hill or one flight of stairs I am very breathless (5) {Numbers; 0-5:140013}  I am not limited doing any activities at home (0) > I am very limited doing activities at home (5) {Numbers; 0-5:140013}  I am confident leaving my home despite my lung function (0) > I am not at all confident leaving my home because of my lung condition (5)  {Numbers; 0-5:140013}  I sleep soundly (0) > I don't sleep soundly because of my lung condition (5) {Numbers; 0-5:140013}  I have lots of energy (0) > I have no energy at all (5) {Numbers; 0-5:140013}   Total CAT Score: ***  Number of hospitilizations in the last year:***  Number of COPD exacerbations in the last year:***  GOLD Stage {1-4:3044014::"1","2","3","4"}, Group {A-D:3044014::"A","B","C","D"}   Objective No Known Allergies  Outpatient Encounter Medications as of 12/16/2019  Medication Sig  . albuterol (PROAIR HFA) 108 (90 Base) MCG/ACT inhaler Inhale 2 puffs into the lungs every 6 (six) hours as needed for wheezing or shortness of breath.  .  Budeson-Glycopyrrol-Formoterol (BREZTRI AEROSPHERE) 160-9-4.8 MCG/ACT AERO Inhale 2 puffs into the lungs 2 (two) times daily.   No facility-administered encounter medications on file as of 12/16/2019.     Immunization History  Administered Date(s) Administered  . Influenza-Unspecified 11/12/2019     PFTs FEV1: ***  Chest X-ray  Eosinophils Most recent blood eosinophil count was *** cells/microL taken on ***.   In-Check DIAL  Inspiratory flow measured using the In-check DIAL G16 and was in range for use of *** device. Patient scored ***.  Assessment and Plan  1. Inhaler Optimization  2. Medication Reconciliation a. A drug regimen assessment was performed, including review of allergies, interactions, disease-state management, dosing and immunization history. Medications were reviewed with the patient, including name, instructions, indication, goals of therapy, potential side effects, importance of adherence, and safe use.  3. Immunizations a. Patient is indicated for the influenzae, pneumonia, and shingles vaccinations.

## 2019-12-16 ENCOUNTER — Ambulatory Visit: Payer: BC Managed Care – PPO

## 2019-12-16 ENCOUNTER — Other Ambulatory Visit: Payer: Self-pay

## 2019-12-16 ENCOUNTER — Ambulatory Visit: Payer: BC Managed Care – PPO | Admitting: Pulmonary Disease

## 2019-12-16 ENCOUNTER — Ambulatory Visit (INDEPENDENT_AMBULATORY_CARE_PROVIDER_SITE_OTHER): Payer: BC Managed Care – PPO | Admitting: Primary Care

## 2019-12-16 ENCOUNTER — Encounter: Payer: Self-pay | Admitting: Primary Care

## 2019-12-16 VITALS — BP 126/74 | HR 66 | Temp 98.2°F | Ht 69.0 in | Wt 222.0 lb

## 2019-12-16 DIAGNOSIS — J449 Chronic obstructive pulmonary disease, unspecified: Secondary | ICD-10-CM | POA: Diagnosis not present

## 2019-12-16 MED ORDER — IPRATROPIUM-ALBUTEROL 0.5-2.5 (3) MG/3ML IN SOLN: 3.0000 mL | Freq: Four times a day (QID) | RESPIRATORY_TRACT | 2 refills | Status: DC | PRN

## 2019-12-16 NOTE — Progress Notes (Signed)
@Patient  ID: , male    DOB: [birth year], 54 y.o.   MRN: [id number]  Chief Complaint  Patient presents with  . Follow-up    Sob-same,Breztri works but does not feel like strong enough,cough-dry occass. white,wheezing,no fcs.Albuterol inhalers not working properly.    Referring provider: No ref. provider found  HPI: 54 year old male, former smoker. PMH significant for COPD with frequent exacerbations and ED visits. Patient of Dr. 44, seen for initial consult on 11/18/19. He also saw pharmacy for inhaler teaching. He has 11/20/19 and is eligible to use co-pay card. Test claim processed for both Breztri and Trelegy which would cost $5. In-Check dial showed that he is a better candidate for MRI inhaler Breztri with spacer (samples given). He was started on prednisone 40mg  x5 days. Recently quit smoking. Due for pneumovax 23 at next visit. No PFTs on file.   12/16/2019 Patient presents today for 3-week follow-up. He is doing ok, reports he has noticed improvement in his breathing since starting [place]. He continues to have some shortness of breath on exertion. Recommend patient use HFA with spacer. Reports that he receives primary care services with [facility]. Declined pneumonia vaccine today. Denies fever, chills, sweats, chest tightness/pain, cough. Needs PFTs.   Data Reviewed: Imaging: CXR 10/20/19 - Increased AP diameter, central peribronchial thickening. No infiltrate, effusion or edema  PFT: None on file  No Known Allergies  Immunization History  Administered Date(s) Administered  . Influenza-Unspecified 11/12/2019    Past Medical History:  Diagnosis Date  . COPD (chronic obstructive pulmonary disease) (HCC)   . Migraine   . Migraine with status migrainosus 02/26/2013  . Pneumonia     Tobacco History: Social History   Tobacco Use  Smoking Status Former Smoker  . Packs/day: 2.00  . Years: 42.00  . Pack years: 84.00  . Types:  Cigarettes  . Quit date: 11/11/2019  . Years since quitting: 0.1  Smokeless Tobacco Never Used  Tobacco Comment   quit days ago   Counseling given: Not Answered Comment: quit days ago   Outpatient Medications Prior to Visit  Medication Sig Dispense Refill  . albuterol (PROAIR HFA) 108 (90 Base) MCG/ACT inhaler Inhale 2 puffs into the lungs every 6 (six) hours as needed for wheezing or shortness of breath. 18 g 3  . Budeson-Glycopyrrol-Formoterol (BREZTRI AEROSPHERE) 160-9-4.8 MCG/ACT AERO Inhale 2 puffs into the lungs 2 (two) times daily. 10.7 g 5   No facility-administered medications prior to visit.   Review of Systems  Review of Systems  Constitutional: Negative.   Respiratory: Positive for cough, shortness of breath and wheezing.   Cardiovascular: Negative.    Physical Exam  BP 126/74 (BP Location: Right Arm, Cuff Size: Large)   Pulse 66   Temp 98.2 F (36.8 C) (Temporal)   Ht 5\' 9"  (1.753 m)   Wt 222 lb (100.7 kg)   SpO2 99%   BMI 32.78 kg/m  Physical Exam Constitutional:      Appearance: Normal appearance.  HENT:     Mouth/Throat:     Comments: Deferred d/t masking Cardiovascular:     Rate and Rhythm: Normal rate.  Pulmonary:     Comments: Diminished, scattered wheezing  Skin:    General: Skin is warm and dry.  Neurological:     General: No focal deficit present.     Mental Status: He is alert. Mental status is at baseline.  Psychiatric:        Mood and  Affect: Mood normal.        Behavior: Behavior normal.        Thought Content: Thought content normal.     Lab Results:  CBC    Component Value Date/Time   WBC 5.1 10/20/2019 1730   RBC 4.70 10/20/2019 1730   HGB 14.5 10/20/2019 1730   HCT 42.2 10/20/2019 1730   PLT 293 10/20/2019 1730   MCV 89.8 10/20/2019 1730   MCH 30.9 10/20/2019 1730   MCHC 34.4 10/20/2019 1730   RDW 13.4 10/20/2019 1730   LYMPHSABS 1.0 08/26/2019 1541   MONOABS 0.4 08/26/2019 1541   EOSABS 0.4 08/26/2019 1541    BASOSABS 0.0 08/26/2019 1541    BMET    Component Value Date/Time   NA 135 10/20/2019 1730   K 3.6 10/20/2019 1730   CL 104 10/20/2019 1730   CO2 21 (L) 10/20/2019 1730   GLUCOSE 144 (H) 10/20/2019 1730   BUN 9 10/20/2019 1730   CREATININE 1.15 10/20/2019 1730   CALCIUM 9.3 10/20/2019 1730   GFRNONAA >60 10/20/2019 1730   GFRAA >60 10/20/2019 1730    BNP No results found for: BNP  ProBNP No results found for: PROBNP  Imaging: No results found.   Assessment & Plan:   COPD with chronic bronchitis ([place]) - Improvement with Breztri two puffs twice daily - Adding Duoneb q 6 hours prn sob/wheezing   - Recommend spacer with HFA inhaler  - Advised pneumonia vaccine but patient declined  - Needs PFTs - FU 6-8 weeks with Dr. Loanne Drilling with 6-minute walk test   Martyn Ehrich, NP 12/25/2019

## 2019-12-16 NOTE — Patient Instructions (Addendum)
Recommendations: - Continue Breztri two puffs twice daily with spacer  - Start DuoNeb nebulizer every 6 hours as needed for breakthrough shortness of breath or wheezing - Recommend you get the pneumonia vaccine at next visit (given material to read about)  Referral: - DME company for new nebulizer machine  Orders: - PFTs   - Needs Spacer   Follow-up: - Pharmacy in 1 week  - 6-8 weeks with Dr. Everardo All with 6-minute walk test

## 2019-12-18 ENCOUNTER — Other Ambulatory Visit: Payer: Self-pay

## 2019-12-25 ENCOUNTER — Encounter: Payer: Self-pay | Admitting: Primary Care

## 2019-12-25 NOTE — Assessment & Plan Note (Signed)
-   Improvement with Breztri two puffs twice daily - Adding Duoneb q 6 hours prn sob/wheezing   - Recommend spacer with HFA inhaler  - Advised pneumonia vaccine but patient declined  - Needs PFTs - FU 6-8 weeks with Dr. Everardo All with 6-minute walk test

## 2019-12-31 ENCOUNTER — Telehealth: Payer: Self-pay | Admitting: Primary Care

## 2019-12-31 MED ORDER — IPRATROPIUM-ALBUTEROL 0.5-2.5 (3) MG/3ML IN SOLN: 3.0000 mL | Freq: Four times a day (QID) | RESPIRATORY_TRACT | 2 refills | Status: DC | PRN

## 2019-12-31 NOTE — Telephone Encounter (Signed)
Spoke with the pt  He is asking about Duoneb- looks like the rx was not ever sent to pharm  I have sent this in for him  He wonders about returning to work, but states still has a lot of wheezing and is still having increased SOB  I have scheduled him for televisit with Beth tomorrow at 11 am.

## 2020-01-01 ENCOUNTER — Ambulatory Visit: Payer: BC Managed Care – PPO | Admitting: Primary Care

## 2020-01-01 ENCOUNTER — Ambulatory Visit (INDEPENDENT_AMBULATORY_CARE_PROVIDER_SITE_OTHER): Payer: BC Managed Care – PPO | Admitting: Primary Care

## 2020-01-01 ENCOUNTER — Other Ambulatory Visit: Payer: Self-pay

## 2020-01-01 ENCOUNTER — Encounter: Payer: Self-pay | Admitting: Primary Care

## 2020-01-01 ENCOUNTER — Telehealth: Payer: Self-pay

## 2020-01-01 DIAGNOSIS — J449 Chronic obstructive pulmonary disease, unspecified: Secondary | ICD-10-CM | POA: Diagnosis not present

## 2020-01-01 NOTE — Progress Notes (Signed)
Virtual Visit via Telephone Note  I connected with [name] on 01/01/20 at  3:00 PM EST by telephone and verified that I am speaking with the correct person using two identifiers.  Location: Patient: Home Provider: Office   I discussed the limitations, risks, security and privacy concerns of performing an evaluation and management service by telephone and the availability of in person appointments. I also discussed with the patient that there may be a patient responsible charge related to this service. The patient expressed understanding and agreed to proceed.   History of Present Illness: 54 year old male, former smoker. PMH significant for COPD with frequent exacerbations and ED visits. Patient of Dr. Everardo All, seen for initial consult on 11/18/19. He also saw pharmacy for inhaler teaching. He has Nurse, learning disability and is eligible to use co-pay card. Test claim processed for both Breztri and Trelegy which would cost $5. In-Check dial showed that he is a better candidate for MRI inhaler Breztri with spacer (samples given). He was started on prednisone 40mg  x5 days. Recently quit smoking. Due for pneumovax 23 at next visit. No PFTs on file.    12/16/2019 Patient presents today for 3-week follow-up. He is doing ok, reports he has noticed improvement in his breathing since starting [place]. He continues to have some shortness of breath on exertion. Recommend patient use HFA with spacer. Reports that he receives primary care services [facility]. Declined pneumonia vaccine today. Denies fever, chills, sweats, chest tightness/pain, cough. Needs PFTs.   01/01/2020  Patient contacted today for acute visit. Reports that he is still having some wheezing. Received his nebulizer machine last week and duoneb medication yesterday. He took first dose of nebulizer last night. 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 He is currently out of work on short term disability since November 17th d.t respiratory complications. He is due for a follow-up with PFTs and 6 MWT in February.    Observations/Objective:  - No observed shortness of breath, wheezing or cough noted during phone conversation  Assessment and Plan:  COPD: - Unknown GOLD stage, frequent exacerbations  - Continue Breztri two puffs twice daily - Start prn duoneb q 6 hours - Out on short term disability - Needs PFTs and March before returning to work  Tobacco abuse: - Reported that he recently quit smoking, he may still be smoking or using marijuana- need to reassess at next visit - We had recommended pharmacy consult for smoking cessation   Follow Up Instructions:   - FU scheduled for February/March  I discussed the assessment and treatment plan with the patient. The patient was provided an opportunity to ask questions and all were answered. The patient agreed with the plan and demonstrated an understanding of the instructions.   The patient was advised to call back or seek an in-person evaluation if the symptoms worsen or if the condition fails to improve as anticipated.  I provided 22 minutes of non-face-to-face time during this encounter.   05-19-1976, NP

## 2020-01-01 NOTE — Patient Instructions (Signed)
-   Continue Breztri two puffs twice daily - Start duoneb every 6 hours for sob/wheezing - Needs PFTs and before returning to work - Follow up in February

## 2020-01-01 NOTE — Telephone Encounter (Signed)
I called pt for an 11:00 Televisit with [name] but was unable to reach the patient. If the patient calls back please reschedule.

## 2020-01-18 ENCOUNTER — Encounter: Payer: Self-pay | Admitting: Pulmonary Disease

## 2020-01-18 ENCOUNTER — Ambulatory Visit (INDEPENDENT_AMBULATORY_CARE_PROVIDER_SITE_OTHER): Payer: BC Managed Care – PPO | Admitting: Pulmonary Disease

## 2020-01-18 ENCOUNTER — Ambulatory Visit (INDEPENDENT_AMBULATORY_CARE_PROVIDER_SITE_OTHER): Payer: BC Managed Care – PPO

## 2020-01-18 ENCOUNTER — Other Ambulatory Visit: Payer: Self-pay

## 2020-01-18 ENCOUNTER — Ambulatory Visit (INDEPENDENT_AMBULATORY_CARE_PROVIDER_SITE_OTHER): Payer: BC Managed Care – PPO | Admitting: Family Medicine

## 2020-01-18 ENCOUNTER — Telehealth: Payer: Self-pay | Admitting: Primary Care

## 2020-01-18 VITALS — BP 116/70 | HR 91 | Temp 98.7°F | Wt 209.8 lb

## 2020-01-18 DIAGNOSIS — J449 Chronic obstructive pulmonary disease, unspecified: Secondary | ICD-10-CM

## 2020-01-18 DIAGNOSIS — R0789 Other chest pain: Secondary | ICD-10-CM

## 2020-01-18 DIAGNOSIS — Z720 Tobacco use: Secondary | ICD-10-CM

## 2020-01-18 DIAGNOSIS — R0602 Shortness of breath: Secondary | ICD-10-CM

## 2020-01-18 DIAGNOSIS — Z1152 Encounter for screening for COVID-19: Secondary | ICD-10-CM

## 2020-01-18 DIAGNOSIS — Z20822 Contact with and (suspected) exposure to covid-19: Secondary | ICD-10-CM | POA: Diagnosis not present

## 2020-01-18 MED ORDER — METHYLPREDNISOLONE SODIUM SUCC 125 MG IJ SOLR
125.0000 mg | Freq: Once | INTRAMUSCULAR | Status: AC
  Administered 2020-01-18: 20:00:00 125 mg via INTRAMUSCULAR

## 2020-01-18 MED ORDER — PROMETHAZINE-DM 6.25-15 MG/5ML PO SOLN: 5.0000 mL | Freq: Four times a day (QID) | ORAL | 0 refills | Status: DC | PRN

## 2020-01-18 MED ORDER — BENZONATATE 100 MG PO CAPS: 100.0000 mg | ORAL_CAPSULE | Freq: Three times a day (TID) | ORAL | 0 refills | Status: DC | PRN

## 2020-01-18 MED ORDER — DOXYCYCLINE HYCLATE 100 MG PO CAPS: 100.0000 mg | ORAL_CAPSULE | Freq: Two times a day (BID) | ORAL | 0 refills | Status: DC

## 2020-01-18 MED ORDER — PREDNISONE 20 MG PO TABS: 20.0000 mg | ORAL_TABLET | Freq: Every day | ORAL | 0 refills | Status: DC

## 2020-01-18 NOTE — Patient Instructions (Addendum)
If chest pain worsens or doesn't improve with prescribed therapy, go immediately to the ER.    Acute Bronchitis, Adult  Acute bronchitis is when air tubes in the lungs (bronchi) suddenly get swollen. The condition can make it hard for you to breathe. In adults, acute bronchitis usually goes away within 2 weeks. A cough caused by bronchitis may last up to 3 weeks. Smoking, allergies, and asthma can make the condition worse. What are the causes? This condition is caused by:  Cold and flu viruses. The most common cause of this condition is the virus that causes the common cold.  Bacteria.  Substances that irritate the lungs, including: ? Smoke from cigarettes and other types of tobacco. ? Dust and pollen. ? Fumes from chemicals, gases, or burned fuel. ? Other materials that pollute indoor or outdoor air.  Close contact with someone who has acute bronchitis. What increases the risk? The following factors may make you more likely to develop this condition:  A weak body's defense system. This is also called the immune system.  Any condition that affects your lungs and breathing, such as asthma. What are the signs or symptoms? Symptoms of this condition include:  A cough.  Coughing up clear, yellow, or green mucus.  Wheezing.  Chest congestion.  Shortness of breath.  A fever.  Body aches.  Chills.  A sore throat. How is this treated? Acute bronchitis may go away over time without treatment. Your doctor may recommend:  Drinking more fluids.  Taking a medicine for a fever or cough.  Using a device that gets medicine into your lungs (inhaler).  Using a vaporizer or a humidifier. These are machines that add water or moisture in the air to help with coughing and poor breathing. Follow these instructions at home:  Activity  Get a lot of rest.  Avoid places where there are fumes from chemicals.  Return to your normal activities as told by your doctor. Ask your  doctor what activities are safe for you. Lifestyle  Drink enough fluids to keep your pee (urine) pale yellow.  Do not drink alcohol.  Do not use any products that contain nicotine or tobacco, such as cigarettes, e-cigarettes, and chewing tobacco. If you need help quitting, ask your doctor. Be aware that: ? Your bronchitis will get worse if you smoke or breathe in other people's smoke (secondhand smoke). ? Your lungs will heal faster if you quit smoking. General instructions  Take over-the-counter and prescription medicines only as told by your doctor.  Use an inhaler, cool mist vaporizer, or humidifier as told by your doctor.  Rinse your mouth often with salt water. To make salt water, dissolve -1 tsp (3-6 g) of salt in 1 cup (237 mL) of warm water.  Keep all follow-up visits as told by your doctor. This is important. How is this prevented? To lower your risk of getting this condition again:  Wash your hands often with soap and water. If soap and water are not available, use hand sanitizer.  Avoid contact with people who have cold symptoms.  Try not to touch your mouth, nose, or eyes with your hands.  Make sure to get the flu shot every year. Contact a doctor if:  Your symptoms do not get better in 2 weeks.  You vomit more than once or twice.  You have symptoms of loss of fluid from your body (dehydration). These include: ? Dark urine. ? Dry skin or eyes. ? Increased thirst. ? Headaches. ?  Confusion. ? Muscle cramps. Get help right away if:  You cough up blood.  You have chest pain.  You have very bad shortness of breath.  You become dehydrated.  You faint or keep feeling like you are going to faint.  You keep vomiting.  You have a very bad headache.  Your fever or chills get worse. These symptoms may be an emergency. Do not wait to see if the symptoms will go away. Get medical help right away. Call your local emergency services (911 in the [place] Do not  drive yourself to the hospital. Summary  Acute bronchitis is when air tubes in the lungs (bronchi) suddenly get swollen. In adults, acute bronchitis usually goes away within 2 weeks.  Take over-the-counter and prescription medicines only as told by your doctor.  Drink enough fluid to keep your pee (urine) pale yellow.  Contact a doctor if your symptoms do not improve after 2 weeks of treatment.  Get help right away if you cough up blood, faint, or have chest pain or shortness of breath. This information is not intended to replace advice given to you by your health care provider. Make sure you discuss any questions you have with your health care provider. Document Revised: 06/12/2019 Document Reviewed: 06/12/2019 Elsevier Patient Education  2020 [employer].

## 2020-01-18 NOTE — Patient Instructions (Addendum)
You were seen today by Coral Ceo, NP  for:   1. COPD with chronic bronchitis (HCC)  Breztri >>> 2 puffs in the morning right when you wake up, rinse out your mouth after use, 12 hours later 2 puffs, rinse after use >>> Take this daily, no matter what >>> This is not a rescue inhaler   Note your daily symptoms > remember "red flags" for COPD:   >>>Increase in cough >>>increase in sputum production >>>increase in shortness of breath or activity  intolerance.   If you notice these symptoms, please call the office to be seen.    2. Tobacco abuse  Please do not smoke  3. Suspected COVID-19 virus infection  I have a high suspicion that you likely have COVID-19 given your symptoms: Shortness of breath, body aches, chills, fatigue, diarrhea, headache  I would like for you to be evaluated tonight at the respiratory clinic in person.  Our office is coordinated an appointment:  01/18/2020 at 6:15 PM   Follow Up:    Return in about 4 days (around 01/22/2020), or if symptoms worsen or fail to improve, for Follow up with Elisha Headland FNP-C.   Please do your part to reduce the spread of COVID-19:      Reduce your risk of any infection  and COVID19 by using the similar precautions used for avoiding the common cold or flu:  Marland Kitchen Wash your hands often with soap and warm water for at least 20 seconds.  If soap and water are not readily available, use an alcohol-based hand sanitizer with at least 60% alcohol.  . If coughing or sneezing, cover your mouth and nose by coughing or sneezing into the elbow areas of your shirt or coat, into a tissue or into your sleeve (not your hands). Drinda Butts A MASK when in public  . Avoid shaking hands with others and consider head nods or verbal greetings only. . Avoid touching your eyes, nose, or mouth with unwashed hands.  . Avoid close contact with people who are sick. . Avoid places or events with large numbers of people in one location, like concerts or  sporting events. . If you have some symptoms but not all symptoms, continue to monitor at home and seek medical attention if your symptoms worsen. . If you are having a medical emergency, call 911.   ADDITIONAL HEALTHCARE OPTIONS FOR PATIENTS  [facility] / e-Visit: [email or web address]         [facility]: [phone]  [facility]: [phone]                   [facility]: [phone]     It is flu season:   >>> Best ways to protect herself from the flu: Receive the yearly flu vaccine, practice good hand hygiene washing with soap and also using hand sanitizer when available, eat a nutritious meals, get adequate rest, hydrate appropriately   Please contact the office if your symptoms worsen or you have concerns that you are not improving.   Thank you for choosing [facility] for your healthcare, and for allowing [place] to partner with you on your healthcare journey. I am thankful to be able to provide care to you today.   Elisha Headland FNP-C

## 2020-01-18 NOTE — Progress Notes (Signed)
Virtual Visit via Telephone Note  I connected with [name] on 01/18/20 at  3:30 PM EST by telephone and verified that I am speaking with the correct person using two identifiers.  Location: Patient: Home Provider: Office [facility] - [address], [place], [place], [place] [address]   I discussed the limitations, risks, security and privacy concerns of performing an evaluation and management service by telephone and the availability of in person appointments. I also discussed with the patient that there may be a patient responsible charge related to this service. The patient expressed understanding and agreed to proceed.  Patient consented to consult via telephone: Yes People present and their role in pt care: Pt   History of Present Illness:  54 year old male former smoker followed in our office for COPD  Past medical history: Migraine Smoking history: Former smoker.  Quit December/2020.  84-pack-year smoking history. Maintenance: Breztri Patient of: Dr. Loanne Drilling   Chief complaint: Acute visit: Fever, wheezing, cough  54 year old male former smoker followed in our office for COPD.  He is a patient of Dr. Loanne Drilling.  Within the last 6 months he has had 2 emergency room visits directly related to COPD exacerbations.  He has not had any since last office visit with our practice in January/2021.  He was seen by [facility] NP.  He was started on Breztri. Last office visit which was virtual in January/29/2021 for COPD.  Patient spouse contacted our office today reporting severe headache, fever, chills, body aches by 3 days.  Patient is very fatigued.   Patient reporting during televisit today that he started to develop symptoms on 01/11/2020.  Initial symptoms were fatigue and a headache.  Given his past medical history of migraines he initially thought that this was a bad migraine.  Throughout the week he started to develop symptoms of feelings of a fever as well as chills.  He is  progressively become more short of breath and fatigued.  He is also having diarrhea.  Unfortunately they are unable to check temperatures as they do not have a thermometer.  Patient spouse reporting that he has been hydrating well and has been tolerating his meals.  Today was the first day that he did not eat all of his meals.  Patient's spouse Coralyn Mark contacted our office today reporting symptoms and patient was scheduled for a telephone visit to further evaluate.  Patient reports continued adherence to his inhalers.  Observations/Objective:  Social History   Tobacco Use  Smoking Status Former Smoker  . Packs/day: 2.00  . Years: 42.00  . Pack years: 84.00  . Types: Cigarettes  . Quit date: 11/11/2019  . Years since quitting: 0.1  Smokeless Tobacco Never Used  Tobacco Comment   quit days ago   Immunization History  Administered Date(s) Administered  . Influenza-Unspecified 11/12/2019      Assessment and Plan:  Suspected COVID-19 virus infection Symptom onset 01/11/2020 1 week of progressive shortness of breath, fevers and chills Unable to obtain temperature as patient does not have a thermometer and this is a virtual visit Patient also having diarrhea  Plan: Patient scheduled for in person evaluation at the [facility] respiratory clinic 01/18/2020 at 6:15 PM Patient also scheduled for close follow-up with our office telephonically on 01/22/2020 at 9 AM Unfortunately patient likely will be a candidate for monoclonal antibody infusion as he is only 54 years old, and will likely be outside of the 10-day symptom onset when damp at the time his PCR  results  COPD with chronic bronchitis (HCC) Plan: Continue Breztri  Patient present to the [facility] respiratory clinic today for further evaluation 4-day telephonic follow-up with our office Patient still needs pulmonary function testing   Tobacco abuse Can further assess at next visit  We recommend the patient does not smoke   Follow Up  Instructions:  Return in about 4 days (around 01/22/2020), or if symptoms worsen or fail to improve, for Follow up with Elisha Headland FNP-C.   I discussed the assessment and treatment plan with the patient. The patient was provided an opportunity to ask questions and all were answered. The patient agreed with the plan and demonstrated an understanding of the instructions.   The patient was advised to call back or seek an in-person evaluation if the symptoms worsen or if the condition fails to improve as anticipated.  I provided 25 minutes of non-face-to-face time during this encounter.   Coral Ceo, NP

## 2020-01-18 NOTE — Assessment & Plan Note (Signed)
Plan: Continue Breztri  Patient present to the [facility] respiratory clinic today for further evaluation 4-day telephonic follow-up with our office Patient still needs pulmonary function testing

## 2020-01-18 NOTE — Telephone Encounter (Signed)
Spoke with pt's wife, Camelia Eng. States that the pt is not feeling well. Reports shortness of breath, cough, severe headache, fever, chills, body aches x3 days. Terri states that it's not like the pt to just lay around and not doing anything but he has no strength. She denies sick contacts that they know of. Pt has been schedule for a telephone visit with Arlys John today at 1530. Nothing further was needed.

## 2020-01-18 NOTE — Assessment & Plan Note (Signed)
Can further assess at next visit  We recommend the patient does not smoke

## 2020-01-18 NOTE — Assessment & Plan Note (Signed)
Symptom onset 01/11/2020 1 week of progressive shortness of breath, fevers and chills Unable to obtain temperature as patient does not have a thermometer and this is a virtual visit Patient also having diarrhea  Plan: Patient scheduled for in person evaluation at the [facility] clinic 01/18/2020 at 6:15 PM Patient also scheduled for close follow-up with our office telephonically on 01/22/2020 at 9 AM Unfortunately patient likely will be a candidate for monoclonal antibody infusion as he is only 54 years old, and will likely be outside of the 10-day symptom onset when damp at the time his PCR results

## 2020-01-19 ENCOUNTER — Telehealth: Payer: Self-pay | Admitting: Family Medicine

## 2020-01-19 DIAGNOSIS — J180 Bronchopneumonia, unspecified organism: Secondary | ICD-10-CM

## 2020-01-19 MED ORDER — AZITHROMYCIN 250 MG PO TABS: ORAL_TABLET | ORAL | 0 refills | Status: DC

## 2020-01-19 NOTE — Telephone Encounter (Signed)
Patient notified via phone  that his chest x-ray revealed pneumonia right lobe. Continue Doxycyline and I am adding Azithromycin, take both as directed. Continue all other medications as prescribed. Keep follow-up at the respiratory clinic 01/25/20. Medication sent to [facility] [address]

## 2020-01-20 ENCOUNTER — Encounter: Payer: Self-pay | Admitting: Pulmonary Disease

## 2020-01-20 ENCOUNTER — Telehealth: Payer: Self-pay | Admitting: Pulmonary Disease

## 2020-01-20 LAB — SPECIMEN STATUS REPORT

## 2020-01-20 LAB — NOVEL CORONAVIRUS, NAA: SARS-CoV-2, NAA: DETECTED — AB

## 2020-01-20 NOTE — Telephone Encounter (Signed)
Anytime. Thanks for working with the patient.   [name]

## 2020-01-20 NOTE — Progress Notes (Signed)
Patient ID: [name], male    DOB: 1966-04-06, 54 y.o.   MRN: [id number]  PCP: [facility]  No chief complaint on file.   Subjective:  HPI [name] is a 54 y.o. male presents to [facility] for evaluation of symptoms related to on-going shortness of breath, persistent cough, and chest tightness.  Patient has COPD and is followed by pulmonology. He reports today at least 3-4 days for gradually worsening shortness of breath, chest tightness that is present with coughing and occasionally with breathing, fatigue, HA, feeling feverish. He was referred here to the Respiratory Clinic for evaluation of symptoms. He denies any known contact with individuals positive for COVID-19. He has not had a recent COVID-19 test.  High Risk for NOBSJ-62 complications due to COPD. Endorses compliance with inhaler therapy.    Review of Systems Pertinent negatives listed in HPI  Patient Active Problem List   Diagnosis Date Noted  . Suspected COVID-19 virus infection 01/18/2020  . Tobacco abuse 11/18/2019  . COPD with chronic bronchitis ([place]) 11/18/2019  . Migraine with status migrainosus 02/26/2013      Prior to Admission medications   Medication Sig Start Date End Date Taking? Authorizing Provider  albuterol (PROAIR HFA) 108 (90 Base) MCG/ACT inhaler Inhale 2 puffs into the lungs every 6 (six) hours as needed for wheezing or shortness of breath. 11/18/19  Yes Margaretha Seeds, MD  Budeson-Glycopyrrol-Formoterol (BREZTRI AEROSPHERE) 160-9-4.8 MCG/ACT AERO Inhale 2 puffs into the lungs 2 (two) times daily. 11/18/19  Yes Margaretha Seeds, MD  ipratropium-albuterol (DUONEB) 0.5-2.5 (3) MG/3ML SOLN Take 3 mLs by nebulization every 6 (six) hours as needed. 12/31/19  Yes Martyn Ehrich, NP  azithromycin (ZITHROMAX) 250 MG tablet Take 2 tabs by mouth x 1 dose, then 1 tab every day for x 4 days 01/19/20   Scot Jun, FNP  benzonatate (TESSALON)  100 MG capsule Take 1-2 capsules (100-200 mg total) by mouth 3 (three) times daily as needed for cough. 01/18/20   Scot Jun, FNP  doxycycline (VIBRAMYCIN) 100 MG capsule Take 1 capsule (100 mg total) by mouth 2 (two) times daily. 01/18/20   Scot Jun, FNP  predniSONE (DELTASONE) 20 MG tablet Take 1 tablet (20 mg total) by mouth daily with breakfast. Prednisone 20 mg, in mornings with breakfast as follows: Take 3 pills for 3 days, take 2 pills for 3 days, and take 1 pill for 3 days. 01/18/20   Scot Jun, FNP  Promethazine-DM 6.25-15 MG/5ML SOLN Take 5 mLs by mouth every 6 (six) hours as needed. 01/18/20   Scot Jun, FNP    Past Medical, Surgical Family and Social History reviewed and updated.    Objective:   Today's Vitals   01/18/20 1850  BP: 116/70  Pulse: 91  Temp: 98.7 F (37.1 C)  SpO2: 96%  Weight: 209 lb 12.8 oz (95.2 kg)    Wt Readings from Last 3 Encounters:  01/18/20 209 lb 12.8 oz (95.2 kg)  12/16/19 222 lb (100.7 kg)  11/18/19 220 lb (99.8 kg)   Physical Exam Constitutional:      Appearance: He is ill-appearing. He is not diaphoretic.  Cardiovascular:     Rate and Rhythm: Normal rate.  Pulmonary:     Breath sounds: Wheezing, rhonchi and rales present.  Musculoskeletal:        General: Normal range of motion.     Cervical back: No rigidity.  Lymphadenopathy:  Cervical: No cervical adenopathy.  Skin:    General: Skin is warm.  Neurological:     Mental Status: He is oriented to person, place, and time.  Psychiatric:        Mood and Affect: Mood is anxious.    Assessment & Plan:  1. Other chest pain - EKG 12-Lead, NSR with ST changes, however, artifact notes as patient had difficulty remaining completely still during ECG due to persistent coughing.  -Advised if CP continues or worsens go to ER -CXR pending.  -Solumedrol 125 mg IM given to reduce bronchial inflammation which is most likely contributing to chest tightness  today.  2. Encounter for screening for COVID-19 - Novel Coronavirus, NAA ([facility]), pending  -No known exposure, although has symptoms concerning for COVID-19  3. Shortness of breath - methylPREDNISolone sodium succinate (SOLU-MEDROL) 125 mg/2 mL injection 125 mg -Start prednisone taper tomorrow: Take Prednisone 20 mg,  in mornings with breakfast as follows:  Take 3 pills for 3 days, Take 2 pills for 3 days, and Take 1 pill for 3 days.  Complete all medication. -Start Doxycyline. If PNA on CXR will add Azithromycin. - DG Chest Portable 2 Views; pending   4. Chronic obstructive pulmonary disease, unspecified COPD type (HCC) See plan # 3 -Continue chronic inhaler therapy. -Promethazine -DM and Benzonatate for management of cough   Meds ordered this encounter  Medications  . methylPREDNISolone sodium succinate (SOLU-MEDROL) 125 mg/2 mL injection 125 mg  . predniSONE (DELTASONE) 20 MG tablet    Sig: Take 1 tablet (20 mg total) by mouth daily with breakfast. Prednisone 20 mg, in mornings with breakfast as follows: Take 3 pills for 3 days, take 2 pills for 3 days, and take 1 pill for 3 days.    Dispense:  18 tablet    Refill:  0  . doxycycline (VIBRAMYCIN) 100 MG capsule    Sig: Take 1 capsule (100 mg total) by mouth 2 (two) times daily.    Dispense:  20 capsule    Refill:  0  . Promethazine-DM 6.25-15 MG/5ML SOLN    Sig: Take 5 mLs by mouth every 6 (six) hours as needed.    Dispense:  280 mL    Refill:  0  . benzonatate (TESSALON) 100 MG capsule    Sig: Take 1-2 capsules (100-200 mg total) by mouth 3 (three) times daily as needed for cough.    Dispense:  60 capsule    Refill:  0         -The patient was given clear instructions to go to ER or return to medical center if symptoms do not improve, worsen or new problems develop. The patient verbalized understanding.     Joaquin Courts, FNP-C [facility], PRN Provider  [facility]. [place], [place]  Clinic  Phone: [phone] Clinic Fax: [phone] Clinic Hours: 5:30 pm -7:30 pm (Monday-Friday)

## 2020-01-20 NOTE — Telephone Encounter (Signed)
01/20/2020  54 year old male former smoker followed in our office for COPD  Upon chart review saw that patient's SARS-CoV-2 test from the respiratory clinic appointment earlier this week showed a positive test result.  Unfortunately the patient would not qualify for the monoclonal antibody infusion at this point in time.  Patient does not have history of chronic kidney disease, immunosuppression, type 2 diabetes, BMI is within normal range, he is 54 years old with a history of COPD.  Patient is currently taking doxycycline, azithromycin as well as a prednisone taper.  He still does not have the ability to check his temperatures at home because he does not have a thermometer.  They will work on obtaining one today.  Patient is not currently eating well.  He reports that he is losing weight.  He is trying to hydrate.  His wife is working with him on intake of liquids and food.  Patient unfortunately is also not able to check his oxygen levels at home.  Patient spouse [name] is starting to be symptomatic with headaches.  They also have children within the home.  I have instructed [name] to obtain Covid testing.  She will contact the Covid testing number [phone] to schedule an appointment today.  [name] will also contact the children's pediatrician/primary care provider to obtain recommendations regarding their testing.  Plan: Continue doxycycline Continue azithromycin Continue prednisone taper Continue respiratory medications as prescribed Keep scheduled telephonic follow-up with our office later on this week  Emphasized the importance that if symptoms worsen or if we have persistent concerns regarding patient not being able to adequately tolerate liquids and meals patient to present to an urgent care or an emergency room for further evaluation.  Family to work on obtaining a thermometer to monitor temperatures.  Spouse to obtain Covid testing as outlined above  Family to contact  pediatrician of children at home to obtain recommendations for Covid testing  Note routed today to Peter Cairo FNP who saw the patient in person in the respiratory clinic as FYI and Dr. Everardo All pts primary pulmonologist.   Peter Headland, FNP

## 2020-01-21 NOTE — Progress Notes (Signed)
Virtual Visit via Telephone Note  I connected with [name] on 01/22/20 at  9:00 AM EST by telephone and verified that I am speaking with the correct person using two identifiers.  Location: Patient: Home Provider: Office [facility] - [address] [place], [address], [place], [place] [address]   I discussed the limitations, risks, security and privacy concerns of performing an evaluation and management service by telephone and the availability of in person appointments. I also discussed with the patient that there may be a patient responsible charge related to this service. The patient expressed understanding and agreed to proceed.  Patient consented to consult via telephone: Yes People present and their role in pt care: Pt     History of Present Illness:  54 year old male former smoker followed in our office for COPD  Past medical history: Migraine Smoking history: Former smoker.  Quit December/2020.  84-pack-year smoking history. Maintenance: Breztri Patient of: Dr. Everardo All   Chief complaint: 2-day follow-up  54 year old male former smoker followed in our office for COPD.  Patient was recently diagnosed with SARS-CoV-2 on 01/18/2020.  He is complaining of 2-day telephonic follow-up with our office today.  Patient reporting that he feels that he is starting to improve.  He has been able to eat and tolerate liquids.  They still have not been able to obtain a thermometer to be able to check temperatures.  Spouse who is symptomatic with a headache a few days ago is likely getting tested for Covid today.  Patient remains adherent to his inhalers.  Patient sounds audibly less fatigued and more coherent today over the phone.    Observations/Objective:  Social History   Tobacco Use  Smoking Status Former Smoker  . Packs/day: 2.00  . Years: 42.00  . Pack years: 84.00  . Types: Cigarettes  . Quit date: 11/11/2019  . Years since quitting: 0.1  Smokeless Tobacco Never Used   Tobacco Comment   quit days ago   Immunization History  Administered Date(s) Administered  . Influenza-Unspecified 11/12/2019    Assessment and Plan:  COPD with chronic bronchitis (HCC) Plan: Continue Breztri  Keep follow-up with respiratory clinic next week on 01/25/2020 Schedule I week follow-up in office with pulmonary team  COVID-19 virus detected Plan: Keep upcoming follow-up with the respiratory clinic on 01/25/2020 1 week follow-up scheduled for our office for in person evaluation   Follow Up Instructions:  Return in about 1 week (around 01/29/2020), or if symptoms worsen or fail to improve, for Follow up with Elisha Headland FNP-C.   I discussed the assessment and treatment plan with the patient. The patient was provided an opportunity to ask questions and all were answered. The patient agreed with the plan and demonstrated an understanding of the instructions.   The patient was advised to call back or seek an in-person evaluation if the symptoms worsen or if the condition fails to improve as anticipated.  I provided 22 minutes of non-face-to-face time during this encounter.   Coral Ceo, NP

## 2020-01-22 ENCOUNTER — Ambulatory Visit (INDEPENDENT_AMBULATORY_CARE_PROVIDER_SITE_OTHER): Payer: BC Managed Care – PPO | Admitting: Pulmonary Disease

## 2020-01-22 ENCOUNTER — Encounter: Payer: Self-pay | Admitting: Pulmonary Disease

## 2020-01-22 DIAGNOSIS — Z8616 Personal history of COVID-19: Secondary | ICD-10-CM | POA: Insufficient documentation

## 2020-01-22 DIAGNOSIS — U071 COVID-19: Secondary | ICD-10-CM

## 2020-01-22 DIAGNOSIS — J449 Chronic obstructive pulmonary disease, unspecified: Secondary | ICD-10-CM

## 2020-01-22 DIAGNOSIS — J441 Chronic obstructive pulmonary disease with (acute) exacerbation: Secondary | ICD-10-CM

## 2020-01-22 NOTE — Patient Instructions (Addendum)
You were seen today by Coral Ceo, NP  for:   1. COVID-19 virus detected  Keep upcoming appointment for in person evaluation in the respiratory clinic on 01/25/2020  We will schedule a 1 week follow-up in our office for further evaluation  2. COPD with chronic bronchitis (HCC)  Breztri >>> 2 puffs in the morning right when you wake up, rinse out your mouth after use, 12 hours later 2 puffs, rinse after use >>> Take this daily, no matter what >>> This is not a rescue inhaler   Note your daily symptoms > remember "red flags" for COPD:   >>>Increase in cough >>>increase in sputum production >>>increase in shortness of breath or activity  intolerance.   If you notice these symptoms, please call the office to be seen.    Follow Up:    Return in about 1 week (around 01/29/2020), or if symptoms worsen or fail to improve, for Follow up with Elisha Headland FNP-C.   Please do your part to reduce the spread of COVID-19:      Reduce your risk of any infection  and COVID19 by using the similar precautions used for avoiding the common cold or flu:  Marland Kitchen Wash your hands often with soap and warm water for at least 20 seconds.  If soap and water are not readily available, use an alcohol-based hand sanitizer with at least 60% alcohol.  . If coughing or sneezing, cover your mouth and nose by coughing or sneezing into the elbow areas of your shirt or coat, into a tissue or into your sleeve (not your hands). Drinda Butts A MASK when in public  . Avoid shaking hands with others and consider head nods or verbal greetings only. . Avoid touching your eyes, nose, or mouth with unwashed hands.  . Avoid close contact with people who are sick. . Avoid places or events with large numbers of people in one location, like concerts or sporting events. . If you have some symptoms but not all symptoms, continue to monitor at home and seek medical attention if your symptoms worsen. . If you are having a medical emergency,  call 911.   ADDITIONAL HEALTHCARE OPTIONS FOR PATIENTS  [place] [facility] / e-Visit: [email or web address]         [facility]: [phone]  [facility]: [phone]                   [facility]: [phone]     It is flu season:   >>> Best ways to protect herself from the flu: Receive the yearly flu vaccine, practice good hand hygiene washing with soap and also using hand sanitizer when available, eat a nutritious meals, get adequate rest, hydrate appropriately   Please contact the office if your symptoms worsen or you have concerns that you are not improving.   Thank you for choosing [facility] for your healthcare, and for allowing [place] to partner with you on your healthcare journey. I am thankful to be able to provide care to you today.   Elisha Headland FNP-C

## 2020-01-22 NOTE — Assessment & Plan Note (Signed)
Plan: Keep upcoming follow-up with the respiratory clinic on 01/25/2020 1 week follow-up scheduled for our office for in person evaluation

## 2020-01-22 NOTE — Assessment & Plan Note (Signed)
Plan: Continue [name]  Keep follow-up with respiratory clinic next week on 01/25/2020 Schedule I week follow-up in office with pulmonary team

## 2020-01-25 ENCOUNTER — Ambulatory Visit (INDEPENDENT_AMBULATORY_CARE_PROVIDER_SITE_OTHER): Payer: BC Managed Care – PPO | Admitting: Family Medicine

## 2020-01-25 ENCOUNTER — Encounter: Payer: Self-pay | Admitting: Family Medicine

## 2020-01-25 ENCOUNTER — Ambulatory Visit (INDEPENDENT_AMBULATORY_CARE_PROVIDER_SITE_OTHER): Payer: BC Managed Care – PPO

## 2020-01-25 VITALS — BP 124/70 | HR 69 | Temp 98.2°F | Ht 69.0 in | Wt 217.2 lb

## 2020-01-25 DIAGNOSIS — R0602 Shortness of breath: Secondary | ICD-10-CM

## 2020-01-25 DIAGNOSIS — J449 Chronic obstructive pulmonary disease, unspecified: Secondary | ICD-10-CM

## 2020-01-25 DIAGNOSIS — J1282 Pneumonia due to coronavirus disease 2019: Secondary | ICD-10-CM | POA: Diagnosis not present

## 2020-01-25 DIAGNOSIS — U071 COVID-19: Secondary | ICD-10-CM | POA: Diagnosis not present

## 2020-01-25 MED ORDER — ALBUTEROL SULFATE HFA 108 (90 BASE) MCG/ACT IN AERS: 2.0000 | INHALATION_SPRAY | Freq: Four times a day (QID) | RESPIRATORY_TRACT | 3 refills | Status: DC | PRN

## 2020-01-25 NOTE — Progress Notes (Signed)
Patient ID: IVA POSTEN, male    DOB: 02/07/1966, 54 y.o.   MRN: [id number]  PCP: [facility], [facility]  No chief complaint on file.   Subjective:  HPI [name] is a 54 y.o. male presents to [facility] for follow-up of COVID-19 pneumonia with acute COPD exacerbation.  Patient seen in clinic a week ago and tested positive for COVID-19.  During last office visit patient had significant increased work of breathing, chest tightness, and a persistent nonproductive cough.  Chest x-ray revealed pneumonia.  Patient was covered with both doxycycline and azithromycin and placed on a prednisone course.  Today he reports improvement of symptoms.  He is managed by pulmonary for COPD and prescribed chronic inhaler therapy.  He endorses decreased use of albuterol rescue inhaler since completing antibiotics and prednisone.  He has been monitoring his oxygen levels at home and they have been stable.  He denies fever or chills.  He is tolerating oral week appropriately.  Chest pain has resolved.  Continues to have cough although cough is controlled with Tessalon Perles and cough syrup.  Review of Systems  Pertinent negatives listed in HPI  Patient Active Problem List   Diagnosis Date Noted  . COVID-19 virus detected 01/22/2020  . Suspected COVID-19 virus infection 01/18/2020  . Tobacco abuse 11/18/2019  . COPD with chronic bronchitis ([place]) 11/18/2019  . Migraine with status migrainosus 02/26/2013      Prior to Admission medications   Medication Sig Start Date End Date Taking? Authorizing Provider  albuterol (PROAIR HFA) 108 (90 Base) MCG/ACT inhaler Inhale 2 puffs into the lungs every 6 (six) hours as needed for wheezing or shortness of breath. 01/25/20  Yes Lauraine Rinne, NP  azithromycin (ZITHROMAX) 250 MG tablet Take 2 tabs by mouth x 1 dose, then 1 tab every day for x 4 days 01/19/20  Yes Scot Jun, FNP  benzonatate (TESSALON) 100 MG capsule Take 1-2  capsules (100-200 mg total) by mouth 3 (three) times daily as needed for cough. 01/18/20  Yes Scot Jun, FNP  Budeson-Glycopyrrol-Formoterol (BREZTRI AEROSPHERE) 160-9-4.8 MCG/ACT AERO Inhale 2 puffs into the lungs 2 (two) times daily. 11/18/19  Yes Margaretha Seeds, MD  doxycycline (VIBRAMYCIN) 100 MG capsule Take 1 capsule (100 mg total) by mouth 2 (two) times daily. 01/18/20  Yes Scot Jun, FNP  ipratropium-albuterol (DUONEB) 0.5-2.5 (3) MG/3ML SOLN Take 3 mLs by nebulization every 6 (six) hours as needed. 12/31/19  Yes Martyn Ehrich, NP  predniSONE (DELTASONE) 20 MG tablet Take 1 tablet (20 mg total) by mouth daily with breakfast. Prednisone 20 mg, in mornings with breakfast as follows: Take 3 pills for 3 days, take 2 pills for 3 days, and take 1 pill for 3 days. 01/18/20  Yes Scot Jun, FNP  Promethazine-DM 6.25-15 MG/5ML SOLN Take 5 mLs by mouth every 6 (six) hours as needed. 01/18/20  Yes Scot Jun, FNP    Past Medical, Surgical Family and Social History reviewed and updated.    Objective:   Today's Vitals   01/25/20 1749  BP: 124/70  Pulse: 69  Temp: 98.2 F (36.8 C)  TempSrc: Oral  SpO2: 94%  Weight: 217 lb 4 oz (98.5 kg)  Height: '5\' 9"'$  (1.753 m)    Wt Readings from Last 3 Encounters:  01/25/20 217 lb 4 oz (98.5 kg)  01/18/20 209 lb 12.8 oz (95.2 kg)  12/16/19 222 lb (100.7 kg)   Physical Exam  General appearance: alert, well developed, thin appearing, cooperative and in no distress Head: Normocephalic, without obvious abnormality, atraumatic Respiratory: Respirations even and unlabored, normal respiratory rate, decreased air movement and lungs diminished. Negative for adventitious lung sound Heart: rate and rhythm normal. No gallop or murmurs noted on exam  Abdomen: BS +, no distention, no rebound tenderness, or no mass Extremities: No gross deformities Skin: Skin color, texture, turgor normal. No rashes seen  Psych: Appropriate  mood and affect. Neurologic: A&O x 3,Thought content appropriate. Gait intact.    Assessment & Plan:  1. Shortness of breath, stable improved  - DG Chest Portable 2 Views, resolved PNA -Continue inhaler therapy -Complete prednisone -Continue follow-up with pulmonary   2. Pneumonia due to  COVID-19 virus  - DG Chest Portable 2 Views, chest x-ray shows resolution of pneumonia. Encouraged to complete remaining doses of Doxycyline. Keep follow-up with pulmonologist  - CBC with Differential/Platelet- Evaluate WBC & HH - Comp Met (CMET); Evaluate LFT , Renal function, and Electrolytes  3. COPD, Stable Work of breathing and SOB have both improved since prior visit -Continue maintenance and rescue inhaler   DG Chest Portable 2 Views  Result Date: 01/25/2020 CLINICAL DATA:  Dyspnea COVID positive EXAM: CHEST  2 VIEW PORTABLE COMPARISON:  01/18/2020, 09/01/2009, 11/02/2018, 12/02/2018 FINDINGS: Mild diffuse coarse interstitial opacity without change. Improved aeration at the right lower lobe. No pleural effusion or pneumothorax. Stable cardiomediastinal silhouette. Prominent hila bilaterally. IMPRESSION: Improved aeration at the right lower lobe. No new focal airspace disease. Hilar prominence bilaterally, question nodes. Suggest short interval radiographic follow-up. Electronically Signed   By: Donavan Foil M.D.   On: 01/25/2020 18:35   DG Chest Portable 2 Views  Result Date: 01/18/2020 CLINICAL DATA:  Short of breath, intermittent fever for 5 days EXAM: CHEST  2 VIEW PORTABLE COMPARISON:  10/20/2019 FINDINGS: Frontal and lateral views of the chest demonstrate an unremarkable cardiac silhouette. There is chronic diffuse interstitial prominence, with patchy right lower lobe airspace disease. No effusion or pneumothorax. No acute bony abnormalities. IMPRESSION: 1. Right lower lobe airspace disease consistent with bronchopneumonia. Electronically Signed   By: Randa Ngo M.D.   On: 01/18/2020  20:39    -The patient was given clear instructions to go to ER or return to medical center if symptoms do not improve, worsen or new problems develop. The patient verbalized understanding.     Molli Barrows, FNP-C [facility], PRN Provider  [facility] [place], [place] [facility] Phone: [phone] Clinic Fax: [phone] Clinic Hours: 5:30 pm -7:30 pm (Monday-Wednesday-Friday)

## 2020-01-25 NOTE — Addendum Note (Signed)
Addended by: Maurene Capes on: 01/25/2020 11:41 AM   Modules accepted: Orders

## 2020-01-25 NOTE — Patient Instructions (Signed)
To improve overall resolution of symptoms, I recommend taking: Vitamin D 5,000 IU daily Vitamin C 500 mg twice daily Zinc 50 mg daily   Complete all medications as prescribed. Follow-up with pulmonary if any symptoms return and for management of COPD.     COPD and Physical Activity Chronic obstructive pulmonary disease (COPD) is a long-term (chronic) condition that affects the lungs. COPD is a general term that can be used to describe many different lung problems that cause lung swelling (inflammation) and limit airflow, including chronic bronchitis and emphysema. The main symptom of COPD is shortness of breath, which makes it harder to do even simple tasks. This can also make it harder to exercise and be active. Talk with your health care provider about treatments to help you breathe better and actions you can take to prevent breathing problems during physical activity. What are the benefits of exercising with COPD? Exercising regularly is an important part of a healthy lifestyle. You can still exercise and do physical activities even though you have COPD. Exercise and physical activity improve your shortness of breath by increasing blood flow (circulation). This causes your heart to pump more oxygen through your body. Moderate exercise can improve your:  Oxygen use.  Energy level.  Shortness of breath.  Strength in your breathing muscles.  Heart health.  Sleep.  Self-esteem and feelings of self-worth.  Depression, stress, and anxiety levels. Exercise can benefit everyone with COPD. The severity of your disease may affect how hard you can exercise, especially at first, but everyone can benefit. Talk with your health care provider about how much exercise is safe for you, and which activities and exercises are safe for you. What actions can I take to prevent breathing problems during physical activity?  Sign up for a pulmonary rehabilitation program. This type of program may  include: ? Education about lung diseases. ? Exercise classes that teach you how to exercise and be more active while improving your breathing. This usually involves:  Exercise using your lower extremities, such as a stationary bicycle.  About 30 minutes of exercise, 2 to 5 times per week, for 6 to 12 weeks  Strength training, such as push ups or leg lifts. ? Nutrition education. ? Group classes in which you can talk with others who also have COPD and learn ways to manage stress.  If you use an oxygen tank, you should use it while you exercise. Work with your health care provider to adjust your oxygen for your physical activity. Your resting flow rate is different from your flow rate during physical activity.  While you are exercising: ? Take slow breaths. ? Pace yourself and do not try to go too fast. ? Purse your lips while breathing out. Pursing your lips is similar to a kissing or whistling position. ? If doing exercise that uses a quick burst of effort, such as weight lifting:  Breathe in before starting the exercise.  Breathe out during the hardest part of the exercise (such as raising the weights). Where to find support You can find support for exercising with COPD from:  Your health care provider.  A pulmonary rehabilitation program.  Your local health department or community health programs.  Support groups, online or in-person. Your health care provider may be able to recommend support groups. Where to find more information You can find more information about exercising with COPD from:  American Lung Association: [place].  COPD Foundation: AlmostHot.gl. Contact a health care provider if:  Your symptoms  get worse.  You have chest pain.  You have nausea.  You have a fever.  You have trouble talking or catching your breath.  You want to start a new exercise program or a new activity. Summary  COPD is a general term that can be used to describe many  different lung problems that cause lung swelling (inflammation) and limit airflow. This includes chronic bronchitis and emphysema.  Exercise and physical activity improve your shortness of breath by increasing blood flow (circulation). This causes your heart to provide more oxygen to your body.  Contact your health care provider before starting any exercise program or new activity. Ask your health care provider what exercises and activities are safe for you. This information is not intended to replace advice given to you by your health care provider. Make sure you discuss any questions you have with your health care provider. Document Revised: 03/11/2019 Document Reviewed: 12/12/2017 Elsevier Patient Education  2020 [employer].

## 2020-01-26 LAB — COMPREHENSIVE METABOLIC PANEL
ALT: 61 IU/L — ABNORMAL HIGH (ref 0–44)
AST: 26 IU/L (ref 0–40)
Albumin/Globulin Ratio: 1 — ABNORMAL LOW (ref 1.2–2.2)
Albumin: 3.2 g/dL — ABNORMAL LOW (ref 3.8–4.9)
Alkaline Phosphatase: 70 IU/L (ref 39–117)
BUN/Creatinine Ratio: 13 (ref 9–20)
BUN: 13 mg/dL (ref 6–24)
Bilirubin Total: 0.4 mg/dL (ref 0.0–1.2)
CO2: 24 mmol/L (ref 20–29)
Calcium: 9.8 mg/dL (ref 8.7–10.2)
Chloride: 106 mmol/L (ref 96–106)
Creatinine, Ser: 0.98 mg/dL (ref 0.76–1.27)
GFR calc Af Amer: 103 mL/min/{1.73_m2} (ref >59)
GFR calc non Af Amer: 89 mL/min/{1.73_m2} (ref >59)
Globulin, Total: 3.1 g/dL (ref 1.5–4.5)
Glucose: 98 mg/dL (ref 65–99)
Potassium: 4.5 mmol/L (ref 3.5–5.2)
Sodium: 143 mmol/L (ref 134–144)
Total Protein: 6.3 g/dL (ref 6.0–8.5)

## 2020-01-26 LAB — CBC WITH DIFFERENTIAL/PLATELET
Basophils Absolute: 0 10*3/uL (ref 0.0–0.2)
Basos: 0 %
EOS (ABSOLUTE): 0.1 10*3/uL (ref 0.0–0.4)
Eos: 2 %
Hematocrit: 38.3 % (ref 37.5–51.0)
Hemoglobin: 12.8 g/dL — ABNORMAL LOW (ref 13.0–17.7)
Immature Grans (Abs): 0.1 10*3/uL (ref 0.0–0.1)
Immature Granulocytes: 1 %
Lymphocytes Absolute: 1.7 10*3/uL (ref 0.7–3.1)
Lymphs: 27 %
MCH: 30 pg (ref 26.6–33.0)
MCHC: 33.4 g/dL (ref 31.5–35.7)
MCV: 90 fL (ref 79–97)
Monocytes Absolute: 0.4 10*3/uL (ref 0.1–0.9)
Monocytes: 6 %
Neutrophils Absolute: 4 10*3/uL (ref 1.4–7.0)
Neutrophils: 64 %
Platelets: 483 10*3/uL — ABNORMAL HIGH (ref 150–450)
RBC: 4.26 x10E6/uL (ref 4.14–5.80)
RDW: 14.1 % (ref 11.6–15.4)
WBC: 6.4 10*3/uL (ref 3.4–10.8)

## 2020-01-27 ENCOUNTER — Telehealth: Payer: Self-pay | Admitting: Family Medicine

## 2020-01-27 ENCOUNTER — Encounter: Payer: Self-pay | Admitting: Family Medicine

## 2020-01-27 NOTE — Telephone Encounter (Signed)
Advised pt of results. Pt understood and nothing further is needed.   

## 2020-01-27 NOTE — Telephone Encounter (Signed)
Notify patient lab work from recent visit consistent with baseline with mild elevation of liver enzyme and platelet count which is consistent finding with an active COVID-19 infection. He should follow-up with his PCP in 2 weeks and have labs repeated.   Joaquin Courts, FNP

## 2020-01-27 NOTE — Progress Notes (Signed)
erroneous

## 2020-01-29 ENCOUNTER — Ambulatory Visit: Payer: BC Managed Care – PPO | Admitting: Pulmonary Disease

## 2020-01-29 ENCOUNTER — Encounter: Payer: Self-pay | Admitting: Pulmonary Disease

## 2020-01-29 ENCOUNTER — Other Ambulatory Visit: Payer: Self-pay

## 2020-01-29 VITALS — BP 118/80 | HR 82 | Temp 98.4°F | Ht 69.0 in | Wt 216.0 lb

## 2020-01-29 DIAGNOSIS — Z87891 Personal history of nicotine dependence: Secondary | ICD-10-CM

## 2020-01-29 DIAGNOSIS — J449 Chronic obstructive pulmonary disease, unspecified: Secondary | ICD-10-CM

## 2020-01-29 DIAGNOSIS — U071 COVID-19: Secondary | ICD-10-CM | POA: Diagnosis not present

## 2020-01-29 NOTE — Progress Notes (Signed)
@Patient  ID: , male    DOB: September 06, 1966, 54 y.o.   MRN: [id number]  Chief Complaint  Patient presents with  . Follow-up    1 wk f/u for COVID. States he is still having issues with SOB and productive cough with greenish phlegm.     Referring provider: [facility]  HPI:  54 year old male former smoker followed in our office for COPD  Past medical history: Migraine Smoking history: Former smoker.  Quit December/2020.  84-pack-year smoking history. Maintenance: Breztri Patient of: Dr. January/2021   01/29/2020  - Visit   54 year old male former smoker followed in our office for suspected COPD.  Patient is also status post COVID-19 infection in February/2021.  Patient was managed outpatient.  He did not receive the monoclonal antibody infusion.  Patient has had multiple follow-ups with the [facility] respiratory clinic.  Chest x-ray showed improved aeration earlier this week.  Patient recently quit smoking December/2020.  84-pack-year smoking history.  He does not have any pulmonary function testing on file.  These are currently ordered have not been scheduled yet given the patient's recent Covid infection.  Patient reports that his breathing has somewhat improved.  Cough has significantly improved is not nearly as productive.  Still having aspects of shortness of breath.  Using his nebulizer as well as his rescue inhaler between 6-7 times a day.  We will review this today.  He continues to be maintained on [place].    Walk today in office reveals no oxygen desaturations.  Questionaires / Pulmonary Flowsheets:   MMRC: mMRC Dyspnea Scale mMRC Score  01/29/2020 1   Tests:   01/18/2020-SARS-CoV-2-detected  01/18/2020-chest x-ray-right lower lobe airspace disease consistent with bronchopneumonia  01/25/2020-chest x-ray-improved aeration of right lower lobe, no new focal airspace disease, hilar prominence bilaterally question nodes, suggest short interval radiographic  follow-up   FENO:  No results found for: NITRICOXIDE  PFT: No flowsheet data found.  WALK:  SIX MIN WALK 01/29/2020  Supplimental Oxygen during Test? (L/min) No  Tech Comments: Patient was able to complete 2 laps at a steady pace. He did have to stop after the 2nd lap due to increased chest tightness and fatigue. Denied any chest pain after walk. No O2 was needed during or after walk.    Imaging: DG Chest Portable 2 Views  Result Date: 01/25/2020 CLINICAL DATA:  Dyspnea COVID positive EXAM: CHEST  2 VIEW PORTABLE COMPARISON:  01/18/2020, 09/01/2009, 11/02/2018, 12/02/2018 FINDINGS: Mild diffuse coarse interstitial opacity without change. Improved aeration at the right lower lobe. No pleural effusion or pneumothorax. Stable cardiomediastinal silhouette. Prominent hila bilaterally. IMPRESSION: Improved aeration at the right lower lobe. No new focal airspace disease. Hilar prominence bilaterally, question nodes. Suggest short interval radiographic follow-up. Electronically Signed   By: 12/04/2018 M.D.   On: 01/25/2020 18:35   DG Chest Portable 2 Views  Result Date: 01/18/2020 CLINICAL DATA:  Short of breath, intermittent fever for 5 days EXAM: CHEST  2 VIEW PORTABLE COMPARISON:  10/20/2019 FINDINGS: Frontal and lateral views of the chest demonstrate an unremarkable cardiac silhouette. There is chronic diffuse interstitial prominence, with patchy right lower lobe airspace disease. No effusion or pneumothorax. No acute bony abnormalities. IMPRESSION: 1. Right lower lobe airspace disease consistent with bronchopneumonia. Electronically Signed   By: 10/22/2019 M.D.   On: 01/18/2020 20:39    Lab Results:  CBC    Component Value Date/Time   WBC 6.4 01/25/2020 1741   WBC 5.1 10/20/2019 1730  RBC 4.26 01/25/2020 1741   RBC 4.70 10/20/2019 1730   HGB 12.8 (L) 01/25/2020 1741   HCT 38.3 01/25/2020 1741   PLT 483 (H) 01/25/2020 1741   MCV 90 01/25/2020 1741   MCH 30.0 01/25/2020 1741    MCH 30.9 10/20/2019 1730   MCHC 33.4 01/25/2020 1741   MCHC 34.4 10/20/2019 1730   RDW 14.1 01/25/2020 1741   LYMPHSABS 1.7 01/25/2020 1741   MONOABS 0.4 08/26/2019 1541   EOSABS 0.1 01/25/2020 1741   BASOSABS 0.0 01/25/2020 1741    BMET    Component Value Date/Time   NA 143 01/25/2020 1741   K 4.5 01/25/2020 1741   CL 106 01/25/2020 1741   CO2 24 01/25/2020 1741   GLUCOSE 98 01/25/2020 1741   GLUCOSE 144 (H) 10/20/2019 1730   BUN 13 01/25/2020 1741   CREATININE 0.98 01/25/2020 1741   CALCIUM 9.8 01/25/2020 1741   GFRNONAA 89 01/25/2020 1741   GFRAA 103 01/25/2020 1741    BNP No results found for: BNP  ProBNP No results found for: PROBNP  Specialty Problems      Pulmonary Problems   COPD with chronic bronchitis (HCC)      No Known Allergies  Immunization History  Administered Date(s) Administered  . Influenza-Unspecified 11/12/2019   Patient is interested in the COVID-19 vaccine, explained to patient that he would be a candidate to receive it 45 days post his Covid infection on 01/18/2020  Past Medical History:  Diagnosis Date  . COPD (chronic obstructive pulmonary disease) (HCC)   . Migraine   . Migraine with status migrainosus 02/26/2013  . Pneumonia     Tobacco History: Social History   Tobacco Use  Smoking Status Former Smoker  . Packs/day: 2.00  . Years: 42.00  . Pack years: 84.00  . Types: Cigarettes  . Quit date: 11/11/2019  . Years since quitting: 0.2  Smokeless Tobacco Never Used  Tobacco Comment   quit days ago   Counseling given: Yes Comment: quit days ago   Continue to not smoke  Outpatient Encounter Medications as of 01/29/2020  Medication Sig  . albuterol (PROAIR HFA) 108 (90 Base) MCG/ACT inhaler Inhale 2 puffs into the lungs every 6 (six) hours as needed for wheezing or shortness of breath.  . benzonatate (TESSALON) 100 MG capsule Take 1-2 capsules (100-200 mg total) by mouth 3 (three) times daily as needed for cough.  .  Budeson-Glycopyrrol-Formoterol (BREZTRI AEROSPHERE) 160-9-4.8 MCG/ACT AERO Inhale 2 puffs into the lungs 2 (two) times daily.  Marland Kitchen ipratropium-albuterol (DUONEB) 0.5-2.5 (3) MG/3ML SOLN Take 3 mLs by nebulization every 6 (six) hours as needed.  . Promethazine-DM 6.25-15 MG/5ML SOLN Take 5 mLs by mouth every 6 (six) hours as needed.  . [DISCONTINUED] azithromycin (ZITHROMAX) 250 MG tablet Take 2 tabs by mouth x 1 dose, then 1 tab every day for x 4 days  . [DISCONTINUED] doxycycline (VIBRAMYCIN) 100 MG capsule Take 1 capsule (100 mg total) by mouth 2 (two) times daily.  . [DISCONTINUED] predniSONE (DELTASONE) 20 MG tablet Take 1 tablet (20 mg total) by mouth daily with breakfast. Prednisone 20 mg, in mornings with breakfast as follows: Take 3 pills for 3 days, take 2 pills for 3 days, and take 1 pill for 3 days.   No facility-administered encounter medications on file as of 01/29/2020.     Review of Systems  Review of Systems  Constitutional: Positive for fatigue. Negative for activity change, chills, fever and unexpected weight change.  HENT: Positive  for congestion. Negative for rhinorrhea, sinus pressure, sinus pain and sore throat.   Eyes: Negative.   Respiratory: Positive for cough, shortness of breath and wheezing.   Cardiovascular: Negative for chest pain, palpitations and leg swelling.  Gastrointestinal: Negative for constipation, diarrhea, nausea and vomiting.  Endocrine: Negative.   Genitourinary: Negative.   Musculoskeletal: Negative.   Skin: Negative.   Neurological: Negative for dizziness and headaches.  Psychiatric/Behavioral: Negative.  Negative for dysphoric mood. The patient is not nervous/anxious.   All other systems reviewed and are negative.    Physical Exam  BP 118/80 (BP Location: Left Arm, Patient Position: Sitting, Cuff Size: Normal)   Pulse 82   Temp 98.4 F (36.9 C) (Temporal)   Ht 5\' 9"  (1.753 m)   Wt 216 lb (98 kg)   SpO2 97% Comment: on RA  BMI 31.90  kg/m   Wt Readings from Last 5 Encounters:  01/29/20 216 lb (98 kg)  01/25/20 217 lb 4 oz (98.5 kg)  01/18/20 209 lb 12.8 oz (95.2 kg)  12/16/19 222 lb (100.7 kg)  11/18/19 220 lb (99.8 kg)    BMI Readings from Last 5 Encounters:  01/29/20 31.90 kg/m  01/25/20 32.08 kg/m  01/18/20 30.98 kg/m  12/16/19 32.78 kg/m  11/18/19 32.49 kg/m     Physical Exam Vitals and nursing note reviewed.  Constitutional:      General: He is not in acute distress.    Appearance: Normal appearance. He is normal weight.  HENT:     Head: Normocephalic and atraumatic.     Right Ear: Hearing, tympanic membrane, ear canal and external ear normal. There is no impacted cerumen.     Left Ear: Hearing, tympanic membrane, ear canal and external ear normal. There is no impacted cerumen.     Nose: Congestion and rhinorrhea present. No mucosal edema.     Right Turbinates: Not enlarged.     Left Turbinates: Not enlarged.     Mouth/Throat:     Mouth: Mucous membranes are dry.     Pharynx: Oropharynx is clear. No oropharyngeal exudate.  Eyes:     Pupils: Pupils are equal, round, and reactive to light.  Cardiovascular:     Rate and Rhythm: Normal rate and regular rhythm.     Pulses: Normal pulses.     Heart sounds: Normal heart sounds. No murmur.  Pulmonary:     Effort: Pulmonary effort is normal.     Breath sounds: No decreased breath sounds, wheezing or rales.     Comments: Diminished breath sounds throughout exam Musculoskeletal:     Cervical back: Normal range of motion.     Right lower leg: No edema.     Left lower leg: No edema.  Lymphadenopathy:     Cervical: No cervical adenopathy.  Skin:    General: Skin is warm and dry.     Capillary Refill: Capillary refill takes less than 2 seconds.     Findings: No erythema or rash.  Neurological:     General: No focal deficit present.     Mental Status: He is alert and oriented to person, place, and time.     Motor: No weakness.      Coordination: Coordination normal.     Gait: Gait is intact. Gait (Tolerated walk in office today) normal.  Psychiatric:        Mood and Affect: Mood normal.        Behavior: Behavior normal. Behavior is cooperative.  Thought Content: Thought content normal.        Judgment: Judgment normal.       Assessment & Plan:   Former smoker Wife smokes outside  Pt stopped smoking in dec/2020 84 pack year smoker   Plan:  We recommend the patient does not smoke  COPD with chronic bronchitis (HCC) Plan: Continue Breztri  We will schedule pulmonary function testing today We will schedule 6-minute walk 4-week follow-up with our office with a chest x-ray  COVID-19 virus detected Plan:  Needs PFTs  Needs 6 min walk  Needs repeat cxry in 4 weeks  Consider HRCT in 3 months after pfts      Return in about 4 weeks (around 02/26/2020), or if symptoms worsen or fail to improve, for Follow up with Dr. Everardo All, Follow up with Elisha Headland FNP-C, Follow up for PFT, With Chest Xray.   Coral Ceo, NP 01/29/2020   This appointment required 32 minutes of patient care (this includes precharting, chart review, review of results, face-to-face care, etc.).

## 2020-01-29 NOTE — Assessment & Plan Note (Signed)
Wife smokes outside  Pt stopped smoking in dec/2020 84 pack year smoker   Plan:  We recommend the patient does not smoke

## 2020-01-29 NOTE — Assessment & Plan Note (Signed)
Plan:  Needs PFTs  Needs 6 min walk  Needs repeat cxry in 4 weeks  Consider HRCT in 3 months after pfts

## 2020-01-29 NOTE — Patient Instructions (Addendum)
You were seen today by Coral Ceo, NP  for:   Pleasure to see you in person.  Loss of taste and smell, fatigue and dry cough can persist for months post COVID-19 infection.  It is reassuring that your chest x-ray has shown some improvement that you had earlier this week.  We will follow these images with a repeat chest x-ray at next office visit in 4 weeks.  Walk today in office no oxygen desaturations this is reassuring.  We will get you scheduled for breathing test to further evaluate your COPD as well as breathing after Covid.  Nice meeting you today,  [name]   1. COVID-19 virus detected  Walk today in office, no desats  We will repeat a chest x-ray on you in 4 weeks at next office visit  We will order a 6-minute walk  Need to complete pulmonary function testing as scheduled  He would be a good candidate for the COVID-19 vaccine when sure 45 days out from your Covid infection.  This will likely be the beginning of April/2021  After your breathing test will likely need to consider CT imaging  2. COPD with chronic bronchitis (HCC)  Breztri >>> 2 puffs in the morning right when you wake up, rinse out your mouth after use, 12 hours later 2 puffs, rinse after use >>> Take this daily, no matter what >>> This is not a rescue inhaler   Only use your albuterol as a rescue medication to be used if you can't catch your breath by resting or doing a relaxed purse lip breathing pattern.  - The less you use it, the better it will work when you need it. - Ok to use up to 2 puffs  every 4 hours if you must but call for immediate appointment if use goes up over your usual need - Don't leave home without it !!  (think of it like the spare tire for your car)   Can use albuterol nebulizer every 4-6 hours as needed for shortness of breath or wheezing  Note your daily symptoms > remember "red flags" for COPD:   >>>Increase in cough >>>increase in sputum production >>>increase in shortness of  breath or activity  intolerance.   If you notice these symptoms, please call the office to be seen.   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 Everardo All, Follow up with Elisha Headland FNP-C, Follow up for PFT, With Chest Xray.  Please schedule patient for full pulmonary function testing Please schedule patient for 6-minute walk Patient needs follow-up in 4 weeks, preferably with Dr. Everardo All Do not schedule pulmonary function test, 6-minute walk in office visit all on same day per office policy  Please do your part to reduce the spread of COVID-19:      Reduce your risk of any infection  and COVID19 by using the similar precautions used for avoiding the common cold or flu:  Marland Kitchen Wash your hands often with soap and warm water for at least 20 seconds.  If soap and water are not readily available, use an alcohol-based hand sanitizer with at least 60% alcohol.  . If coughing or sneezing, cover your mouth and nose by coughing or sneezing into the elbow areas of your shirt or coat, into a tissue or  into your sleeve (not your hands). Langley Gauss A MASK when in public  . Avoid shaking hands with others and consider head nods or verbal greetings only. . Avoid touching your eyes, nose, or mouth with unwashed hands.  . Avoid close contact with people who are sick. . Avoid places or events with large numbers of people in one location, like concerts or sporting events. . If you have some symptoms but not all symptoms, continue to monitor at home and seek medical attention if your symptoms worsen. . If you are having a medical emergency, call 911.   [place] / e-Visit: [email or web address]         [facility]: [facility]: [phone]                   [facility]: [phone]     It is flu season:   >>> Best ways to protect herself from the flu: Receive the yearly flu vaccine, practice good hand hygiene washing with soap and also using hand sanitizer when available, eat a nutritious meals, get adequate rest, hydrate appropriately   Please contact the office if your symptoms worsen or you have concerns that you are not improving.   Thank you for choosing [facility] for your healthcare, and for allowing [place] to partner with you on your healthcare journey. I am thankful to be able to provide care to you today.   Wyn Quaker FNP-C    Chronic Obstructive Pulmonary Disease Chronic obstructive pulmonary disease (COPD) is a long-term (chronic) lung problem. When you have COPD, it is hard for air to get in and out of your lungs. Usually the condition gets worse over time, and your lungs will never return to normal. There are things you can do to keep yourself as healthy as possible.  Your doctor may treat your condition with: ? Medicines. ? Oxygen. ? Lung surgery.  Your doctor may also recommend: ? Rehabilitation. This includes steps to make your body work better. It may involve a team of specialists. ? Quitting smoking, if you smoke. ? Exercise and changes to your diet. ? Comfort measures (palliative care). Follow these instructions at home: Medicines  Take over-the-counter and prescription medicines only as told by your doctor.  Talk to your doctor before taking any cough or allergy medicines. You may need to avoid medicines that cause your lungs to be dry. Lifestyle  If you smoke, stop. Smoking makes the problem worse. If you need help quitting, ask your doctor.  Avoid being around things that make your breathing worse. This may include smoke, chemicals, and fumes.  Stay active, but remember to rest as well.  Learn and use tips on how to  relax.  Make sure you get enough sleep. Most adults need at least 7 hours of sleep every night.  Eat healthy foods. Eat smaller meals more often. Rest before meals. Controlled breathing Learn and use tips on how to control your breathing as told by your doctor. Try:  Breathing in (inhaling) through your nose for 1 second. Then, pucker your lips and breath out (exhale) through your lips for 2 seconds.  Putting one hand on your belly (abdomen). Breathe in slowly through your nose for 1 second. Your hand on your belly should move out. Pucker your lips and breathe out slowly through your lips. Your hand on your belly should move in as you breathe  out.  Controlled coughing Learn and use controlled coughing to clear mucus from your lungs. Follow these steps: 1. Lean your head a little forward. 2. Breathe in deeply. 3. Try to hold your breath for 3 seconds. 4. Keep your mouth slightly open while coughing 2 times. 5. Spit any mucus out into a tissue. 6. Rest and do the steps again 1 or 2 times as needed. General instructions  Make sure you get all the shots (vaccines) that your doctor recommends. Ask your doctor about a flu shot and a pneumonia shot.  Use oxygen therapy and pulmonary rehabilitation if told by your doctor. If you need home oxygen therapy, ask your doctor if you should buy a tool to measure your oxygen level (oximeter).  Make a COPD action plan with your doctor. This helps you to know what to do if you feel worse than usual.  Manage any other conditions you have as told by your doctor.  Avoid going outside when it is very hot, cold, or humid.  Avoid people who have a sickness you can catch (contagious).  Keep all follow-up visits as told by your doctor. This is important. Contact a doctor if:  You cough up more mucus than usual.  There is a change in the color or thickness of the mucus.  It is harder to breathe than usual.  Your breathing is faster than usual.  You  have trouble sleeping.  You need to use your medicines more often than usual.  You have trouble doing your normal activities such as getting dressed or walking around the house. Get help right away if:  You have shortness of breath while resting.  You have shortness of breath that stops you from: ? Being able to talk. ? Doing normal activities.  Your chest hurts for longer than 5 minutes.  Your skin color is more blue than usual.  Your pulse oximeter shows that you have low oxygen for longer than 5 minutes.  You have a fever.  You feel too tired to breathe normally. Summary  Chronic obstructive pulmonary disease (COPD) is a long-term lung problem.  The way your lungs work will never return to normal. Usually the condition gets worse over time. There are things you can do to keep yourself as healthy as possible.  Take over-the-counter and prescription medicines only as told by your doctor.  If you smoke, stop. Smoking makes the problem worse. This information is not intended to replace advice given to you by your health care provider. Make sure you discuss any questions you have with your health care provider. Document Revised: 11/01/2017 Document Reviewed: 12/24/2016 Elsevier Patient Education  2020 [employer].

## 2020-01-29 NOTE — Assessment & Plan Note (Signed)
Plan: Continue Breztri  We will schedule pulmonary function testing today We will schedule 6-minute walk 4-week follow-up with our office with a chest x-ray

## 2020-02-22 ENCOUNTER — Telehealth: Payer: Self-pay | Admitting: Pulmonary Disease

## 2020-02-22 NOTE — Telephone Encounter (Signed)
Checked Arlys John NP's box at front desk.  Paperwork from Patient is in box for Arlys John to complete.  Message routed to [place], CMA to follow up

## 2020-02-22 NOTE — Telephone Encounter (Signed)
Called and spoke with Patient's Wife, Camelia Eng.  Terri stated she has paperwork for Brian,NP to complete. Della Goo, she can drop paperwork off at front door, and someone will place paperwork in [place] NP's box.

## 2020-02-23 NOTE — Telephone Encounter (Signed)
Called and spoke with pt letting him know that forms were sent to [employer] and pt verbalized understanding. Nothing further needed at this time.

## 2020-02-23 NOTE — Telephone Encounter (Signed)
Pt called back. Please return call.  

## 2020-02-23 NOTE — Telephone Encounter (Signed)
Pt did not answer when I returned his call- LMTCB

## 2020-02-23 NOTE — Telephone Encounter (Signed)
Pt called back, please return call  

## 2020-02-23 NOTE — Telephone Encounter (Signed)
Received form from up front. The form was for short term disability. I gave the forms to [place]. She will forward the forms to [employer].   Called patient but he did not answer. Left message for him to call back.

## 2020-02-26 ENCOUNTER — Telehealth: Payer: Self-pay | Admitting: Pulmonary Disease

## 2020-02-26 ENCOUNTER — Other Ambulatory Visit: Payer: Self-pay | Admitting: Primary Care

## 2020-02-26 NOTE — Telephone Encounter (Signed)
Upon reviewing chart, pt tested positive for Covid-19 on 01/18/2020.  [name] was aware of this and of office policy when he ordered/scheduled these tests on 2/26 televisit.    Spoke with pt, he is ok to return to clinic on Monday for his walk and appt.  Pt expressed understanding.  Nothing further needed at this time- will close encounter.

## 2020-02-29 ENCOUNTER — Ambulatory Visit: Payer: BC Managed Care – PPO

## 2020-02-29 ENCOUNTER — Ambulatory Visit: Payer: BC Managed Care – PPO | Admitting: Pulmonary Disease

## 2020-02-29 ENCOUNTER — Ambulatory Visit (INDEPENDENT_AMBULATORY_CARE_PROVIDER_SITE_OTHER): Payer: BC Managed Care – PPO | Admitting: Pulmonary Disease

## 2020-02-29 ENCOUNTER — Ambulatory Visit (INDEPENDENT_AMBULATORY_CARE_PROVIDER_SITE_OTHER): Payer: BC Managed Care – PPO

## 2020-02-29 ENCOUNTER — Other Ambulatory Visit: Payer: Self-pay

## 2020-02-29 ENCOUNTER — Encounter: Payer: Self-pay | Admitting: Pulmonary Disease

## 2020-02-29 VITALS — BP 130/80 | Temp 98.4°F | Ht 69.0 in | Wt 221.4 lb

## 2020-02-29 DIAGNOSIS — U071 COVID-19: Secondary | ICD-10-CM

## 2020-02-29 DIAGNOSIS — J449 Chronic obstructive pulmonary disease, unspecified: Secondary | ICD-10-CM | POA: Diagnosis not present

## 2020-02-29 DIAGNOSIS — Z8616 Personal history of COVID-19: Secondary | ICD-10-CM | POA: Diagnosis not present

## 2020-02-29 DIAGNOSIS — Z87891 Personal history of nicotine dependence: Secondary | ICD-10-CM | POA: Diagnosis not present

## 2020-02-29 NOTE — Progress Notes (Signed)
Six Minute Walk - 02/29/20 1424      Six Minute Walk   Medications taken before test (dose and time)  NONE    Supplemental oxygen during test?  No    Lap distance in meters   34 meters    Laps Completed  13    Partial lap (in meters)  0 meters    Baseline BP (sitting)  122/86    Baseline Heartrate  78    Baseline Dyspnea (Borg Scale)  4    Baseline Fatigue (Borg Scale)  1    Baseline SPO2  96 %      End of Test Values    BP (sitting)  132/80    Heartrate  94    Dyspnea (Borg Scale)  4    Fatigue (Borg Scale)  1    SPO2  96 %      2 Minutes Post Walk Values   BP (sitting)  130/80    Heartrate  85    SPO2  98 %    Stopped or paused before six minutes?  No    Other Symptoms at end of exercise:  Dizziness;Leg pain   normal since having covid     Interpretation   Distance completed  442 meters    Tech Comments:  Patient walked average pace and completed entire 6 min walk without stopping. No oxygen was needed.

## 2020-02-29 NOTE — Progress Notes (Signed)
@Patient  ID: [name], male    DOB: [birth year], 54 y.o.   MRN: [id number]  Chief Complaint  Patient presents with  . Follow-up    F/U after 1mw. States his right foot has been swollen since last visit.     Referring provider: [facility]  HPI:  54 year old male former smoker followed in our office for COPD  Past medical history: Migraine Smoking history: Former smoker.  Quit December/2020.  84-pack-year smoking history. Maintenance: Breztri Patient of: Dr. Loanne Drilling   02/29/2020  - Visit   54 year old male former smoker followed in our office for suspected COPD.  Patient is also status post COVID-19 infection.  He is presenting today after completing a 6-minute walk.  Patient also has an order pending for pulmonary function testing.  This is scheduled to be completed later on this month.  Patient reports he is using his rescue nebulizer about 2 times daily.  He is not have to use his rescue inhaler very often.  Patient feels that he is doing somewhat better today.  Still has occasional short number of shortness of breath when lying flat.  He is wondering if he can return to work.  Patient completed 6-minute walk today without any oxygen desaturations on room air and did well.  Questionaires / Pulmonary Flowsheets:   MMRC: mMRC Dyspnea Scale mMRC Score  02/29/2020 1  01/29/2020 1    Tests:   01/18/2020-SARS-CoV-2-detected  01/18/2020-chest x-ray-right lower lobe airspace disease consistent with bronchopneumonia  01/25/2020-chest x-ray-improved aeration of right lower lobe, no new focal airspace disease, hilar prominence bilaterally question nodes, suggest short interval radiographic follow-up   FENO:  No results found for: NITRICOXIDE  PFT: No flowsheet data found.  WALK:  SIX MIN WALK 02/29/2020 01/29/2020  Medications NONE -  Supplimental Oxygen during Test? (L/min) No No  Laps 13 -  Partial Lap (in Meters) 0 -  Baseline BP (sitting) 122/86 -   Baseline Heartrate 78 -  Baseline Dyspnea (Borg Scale) 4 -  Baseline Fatigue (Borg Scale) 1 -  Baseline SPO2 96 -  BP (sitting) 132/80 -  Heartrate 94 -  Dyspnea (Borg Scale) 4 -  Fatigue (Borg Scale) 1 -  SPO2 96 -  BP (sitting) 130/80 -  Heartrate 85 -  SPO2 98 -  Stopped or Paused before Six Minutes No -  Interpretation Dizziness;Leg pain -  Distance Completed 442 -  Distance Completed 0 -  Tech Comments: Patient walked average pace and completed entire 6 min walk without stopping. No oxygen was needed. Patient was able to complete 2 laps at a steady pace. He did have to stop after the 2nd lap due to increased chest tightness and fatigue. Denied any chest pain after walk. No O2 was needed during or after walk.    Imaging: DG Chest 2 View  Result Date: 02/29/2020 CLINICAL DATA:  Follow-up COVID infection EXAM: CHEST - 2 VIEW COMPARISON:  01/25/2020, 01/18/2020, 10/20/2019 FINDINGS: Clearing of previously noted right lower lobe opacity. No acute consolidation, pleural effusion or pneumothorax. Normal cardiomediastinal silhouette. Decreased hilar prominence. No pneumothorax. IMPRESSION: No active cardiopulmonary disease. Electronically Signed   By: Donavan Foil M.D.   On: 02/29/2020 15:39    Lab Results:  CBC    Component Value Date/Time   WBC 6.4 01/25/2020 1741   WBC 5.1 10/20/2019 1730   RBC 4.26 01/25/2020 1741   RBC 4.70 10/20/2019 1730   HGB 12.8 (L) 01/25/2020 1741   HCT 38.3 01/25/2020  1741   PLT 483 (H) 01/25/2020 1741   MCV 90 01/25/2020 1741   MCH 30.0 01/25/2020 1741   MCH 30.9 10/20/2019 1730   MCHC 33.4 01/25/2020 1741   MCHC 34.4 10/20/2019 1730   RDW 14.1 01/25/2020 1741   LYMPHSABS 1.7 01/25/2020 1741   MONOABS 0.4 08/26/2019 1541   EOSABS 0.1 01/25/2020 1741   BASOSABS 0.0 01/25/2020 1741    BMET    Component Value Date/Time   NA 143 01/25/2020 1741   K 4.5 01/25/2020 1741   CL 106 01/25/2020 1741   CO2 24 01/25/2020 1741   GLUCOSE 98  01/25/2020 1741   GLUCOSE 144 (H) 10/20/2019 1730   BUN 13 01/25/2020 1741   CREATININE 0.98 01/25/2020 1741   CALCIUM 9.8 01/25/2020 1741   GFRNONAA 89 01/25/2020 1741   GFRAA 103 01/25/2020 1741    BNP No results found for: BNP  ProBNP No results found for: PROBNP  Specialty Problems      Pulmonary Problems   COPD with chronic bronchitis (HCC)      No Known Allergies  Immunization History  Administered Date(s) Administered  . Influenza-Unspecified 11/12/2019    Past Medical History:  Diagnosis Date  . COPD (chronic obstructive pulmonary disease) (HCC)   . Migraine   . Migraine with status migrainosus 02/26/2013  . Pneumonia     Tobacco History: Social History   Tobacco Use  Smoking Status Former Smoker  . Packs/day: 2.00  . Years: 42.00  . Pack years: 84.00  . Types: Cigarettes  . Quit date: 11/11/2019  . Years since quitting: 0.3  Smokeless Tobacco Never Used   Counseling given: Yes   Continue to not smoke  Outpatient Encounter Medications as of 02/29/2020  Medication Sig  . albuterol (PROAIR HFA) 108 (90 Base) MCG/ACT inhaler Inhale 2 puffs into the lungs every 6 (six) hours as needed for wheezing or shortness of breath.  . benzonatate (TESSALON) 100 MG capsule Take 1-2 capsules (100-200 mg total) by mouth 3 (three) times daily as needed for cough.  . Budeson-Glycopyrrol-Formoterol (BREZTRI AEROSPHERE) 160-9-4.8 MCG/ACT AERO Inhale 2 puffs into the lungs 2 (two) times daily.  Marland Kitchen ipratropium-albuterol (DUONEB) 0.5-2.5 (3) MG/3ML SOLN TAKE 3 MLS BY NEBULIZATION EVERY 6 (SIX) HOURS AS NEEDED.  Marland Kitchen Promethazine-DM 6.25-15 MG/5ML SOLN Take 5 mLs by mouth every 6 (six) hours as needed.   No facility-administered encounter medications on file as of 02/29/2020.     Review of Systems  Review of Systems  Constitutional: Positive for fatigue. Negative for activity change, chills, fever and unexpected weight change.  HENT: Negative for postnasal drip,  rhinorrhea, sinus pressure, sinus pain and sore throat.   Eyes: Negative.   Respiratory: Positive for shortness of breath. Negative for cough and wheezing.   Cardiovascular: Negative for chest pain and palpitations.  Gastrointestinal: Negative for constipation, diarrhea, nausea and vomiting.  Endocrine: Negative.   Genitourinary: Negative.   Musculoskeletal: Negative.   Skin: Negative.   Neurological: Negative for dizziness and headaches.  Psychiatric/Behavioral: Negative.  Negative for dysphoric mood. The patient is not nervous/anxious.   All other systems reviewed and are negative.    Physical Exam  BP 130/80 (BP Location: Left Arm, Patient Position: Sitting, Cuff Size: Normal)   Temp 98.4 F (36.9 C) (Temporal)   Ht 5\' 9"  (1.753 m)   Wt 221 lb 6.4 oz (100.4 kg)   SpO2 98% Comment: on RA  BMI 32.70 kg/m   Wt Readings from Last 5 Encounters:  02/29/20 221 lb 6.4 oz (100.4 kg)  01/29/20 216 lb (98 kg)  01/25/20 217 lb 4 oz (98.5 kg)  01/18/20 209 lb 12.8 oz (95.2 kg)  12/16/19 222 lb (100.7 kg)    BMI Readings from Last 5 Encounters:  02/29/20 32.70 kg/m  01/29/20 31.90 kg/m  01/25/20 32.08 kg/m  01/18/20 30.98 kg/m  12/16/19 32.78 kg/m     Physical Exam Vitals and nursing note reviewed.  Constitutional:      General: He is not in acute distress.    Appearance: Normal appearance. He is normal weight.  HENT:     Head: Normocephalic and atraumatic.     Right Ear: Hearing, tympanic membrane, ear canal and external ear normal.     Left Ear: Hearing, tympanic membrane, ear canal and external ear normal.     Nose: Nose normal. No mucosal edema or rhinorrhea.     Right Turbinates: Not enlarged.     Left Turbinates: Not enlarged.     Mouth/Throat:     Mouth: Mucous membranes are dry.     Pharynx: Oropharynx is clear. No oropharyngeal exudate.  Eyes:     Pupils: Pupils are equal, round, and reactive to light.  Cardiovascular:     Rate and Rhythm: Normal rate  and regular rhythm.     Pulses: Normal pulses.     Heart sounds: Normal heart sounds. No murmur.  Pulmonary:     Effort: Pulmonary effort is normal.     Breath sounds: No decreased breath sounds, wheezing or rales.     Comments: Diminished breath sounds on exam Abdominal:     General: Bowel sounds are normal. There is no distension.     Palpations: Abdomen is soft.     Tenderness: There is no abdominal tenderness.  Musculoskeletal:     Cervical back: Normal range of motion.     Right lower leg: No edema.     Left lower leg: No edema.  Lymphadenopathy:     Cervical: No cervical adenopathy.  Skin:    General: Skin is warm and dry.     Capillary Refill: Capillary refill takes less than 2 seconds.     Findings: No erythema or rash.  Neurological:     General: No focal deficit present.     Mental Status: He is alert and oriented to person, place, and time.     Motor: No weakness.     Coordination: Coordination normal.     Gait: Gait is intact. Gait normal.  Psychiatric:        Mood and Affect: Mood normal.        Behavior: Behavior normal. Behavior is cooperative.        Thought Content: Thought content normal.        Judgment: Judgment normal.       Assessment & Plan:   History of COVID-19 No longer infectious Tolerated 6-minute walk in office today  Plan: Chest x-ray today Complete pulmonary function testing as scheduled Okay to return to work from pulmonary standpoint at this time   COPD with chronic bronchitis (HCC) Plan: Continue Breztri  Complete pulmonary function testing as ordered Complete follow-up with our office after pulmonary function test Continue rescue inhaler/rescue nebs as needed for shortness of breath or wheezing Continue to not smoke Based off of pulmonary function testing may need to consider referral to lung cancer screening program if FEV1 is greater than 1 L and diffusion defect is not severe  Former smoker Plan: Continue  not  smoke Based off of pulmonary function testing patient can be referred to lung cancer screening program if FEV1 is greater than 1 L and diffusion defect is not severe    Return in about 6 weeks (around 04/11/2020), or if symptoms worsen or fail to improve, for Follow up for PFT, Follow up with Dr. Everardo All.   Coral Ceo, NP 02/29/2020   This appointment required 32 minutes of patient care (this includes precharting, chart review, review of results, face-to-face care, etc.).

## 2020-02-29 NOTE — Assessment & Plan Note (Addendum)
No longer infectious Tolerated 6-minute walk in office today  Plan: Chest x-ray today Complete pulmonary function testing as scheduled Okay to return to work from pulmonary standpoint at this time

## 2020-02-29 NOTE — Assessment & Plan Note (Signed)
Plan: Continue not smoke Based off of pulmonary function testing patient can be referred to lung cancer screening program if FEV1 is greater than 1 L and diffusion defect is not severe

## 2020-02-29 NOTE — Assessment & Plan Note (Signed)
Plan: Continue Breztri  Complete pulmonary function testing as ordered Complete follow-up with our office after pulmonary function test Continue rescue inhaler/rescue nebs as needed for shortness of breath or wheezing Continue to not smoke Based off of pulmonary function testing may need to consider referral to lung cancer screening program if FEV1 is greater than 1 L and diffusion defect is not severe

## 2020-02-29 NOTE — Patient Instructions (Addendum)
You were seen today by Lauraine Rinne, NP  for:   Good seeing you today in office.  We need to get you scheduled for a pulmonary function test.  We will also get an x-ray today.  I believe you can go back to work.  Continue your medications as prescribed.  We will get you seen back in our office after your breathing test with Dr. Loanne Drilling.  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  - The less you use it, the better it will work when you need it. - Ok to use up to 2 puffs  every 4 hours if you must but call for immediate appointment if use goes up over your usual need - Don't leave home without it !!  (think of it like the spare tire for your car)   Note your daily symptoms > remember "red flags" for COPD:   >>>Increase in cough >>>increase in sputum production >>>increase in shortness of breath or activity  intolerance.   If you notice these symptoms, please call the office to be seen.   We need to get your breathing test   2. Former smoker  Continue to not smoke  Continue to encourage her spouse to not smoke around you and to smoke outside of your home  3. History of COVID-19  I believe your paperwork that you sent to our office was sent over to:  [facility] [address] [place], [place] Telephone [phone]  I do not see any sort of current reason why you could not be present at work.  Follow Up:    Return in about 6 weeks (around 04/11/2020), or if symptoms worsen or fail to improve, for Follow up for PFT, Follow up with Dr. Loanne Drilling.   Please do  your part to reduce the spread of COVID-19:      Reduce your risk of any infection  and COVID19 by using the similar precautions used for avoiding the common cold or flu:  Marland Kitchen Wash your hands often with soap and warm water for at least 20 seconds.  If soap and water are not readily available, use an alcohol-based hand sanitizer with at least 60% alcohol.  . If coughing or sneezing, cover your mouth and nose by coughing or sneezing into the elbow areas of your shirt or coat, into a tissue or into your sleeve (not your hands). Langley Gauss A MASK when in public  . Avoid shaking hands with others and consider head nods or verbal greetings only. . Avoid touching your eyes, nose, or mouth with unwashed hands.  . Avoid close contact with people who are sick. . Avoid places or events with large numbers of people in one location, like concerts or sporting events. . If you have some symptoms but not all symptoms, continue to monitor at home and seek medical attention if your symptoms worsen. . If you are having a medical emergency, call 911.   [place] /  e-Visit: [email or web address]         [facility]: [phone]  [facility]: [phone]                   [facility]: [phone]     It is flu season:   >>> Best ways to protect herself from the flu: Receive the yearly flu vaccine, practice good hand hygiene washing with soap and also using hand sanitizer when available, eat a nutritious meals, get adequate rest, hydrate appropriately   Please contact the office if your symptoms worsen or you have concerns that you are not improving.   Thank you for choosing [facility] for your healthcare, and for allowing [place] to partner with you on your healthcare journey. I am thankful to be able to provide care to you today.   Elisha Headland FNP-C

## 2020-03-11 ENCOUNTER — Other Ambulatory Visit: Payer: Self-pay | Admitting: Pulmonary Disease

## 2020-03-11 DIAGNOSIS — J441 Chronic obstructive pulmonary disease with (acute) exacerbation: Secondary | ICD-10-CM

## 2020-03-25 ENCOUNTER — Inpatient Hospital Stay (HOSPITAL_COMMUNITY): Admission: RE | Admit: 2020-03-25 | Payer: BC Managed Care – PPO | Source: Ambulatory Visit

## 2020-03-25 ENCOUNTER — Other Ambulatory Visit: Payer: Self-pay | Admitting: Pulmonary Disease

## 2020-03-25 DIAGNOSIS — J449 Chronic obstructive pulmonary disease, unspecified: Secondary | ICD-10-CM

## 2020-03-28 ENCOUNTER — Ambulatory Visit (INDEPENDENT_AMBULATORY_CARE_PROVIDER_SITE_OTHER): Payer: BC Managed Care – PPO | Admitting: Pulmonary Disease

## 2020-03-28 ENCOUNTER — Encounter: Payer: Self-pay | Admitting: Pulmonary Disease

## 2020-03-28 ENCOUNTER — Other Ambulatory Visit: Payer: Self-pay

## 2020-03-28 VITALS — BP 130/74 | HR 72 | Temp 97.7°F | Ht 69.0 in | Wt 218.2 lb

## 2020-03-28 DIAGNOSIS — J449 Chronic obstructive pulmonary disease, unspecified: Secondary | ICD-10-CM

## 2020-03-28 LAB — PULMONARY FUNCTION TEST
DL/VA % pred: 108 %
DL/VA: 4.81 ml/min/mmHg/L
DLCO cor % pred: 99 %
DLCO cor: 27.36 ml/min/mmHg
DLCO unc % pred: 94 %
DLCO unc: 25.87 ml/min/mmHg
FEF 25-75 Post: 2.79 L/sec
FEF 25-75 Pre: 2.57 L/sec
FEF2575-%Change-Post: 8 %
FEF2575-%Pred-Post: 89 %
FEF2575-%Pred-Pre: 82 %
FEV1-%Change-Post: 1 %
FEV1-%Pred-Post: 110 %
FEV1-%Pred-Pre: 108 %
FEV1-Post: 3.43 L
FEV1-Pre: 3.37 L
FEV1FVC-%Change-Post: -1 %
FEV1FVC-%Pred-Pre: 93 %
FEV6-%Change-Post: 2 %
FEV6-%Pred-Post: 120 %
FEV6-%Pred-Pre: 117 %
FEV6-Post: 4.54 L
FEV6-Pre: 4.42 L
FEV6FVC-%Change-Post: 0 %
FEV6FVC-%Pred-Post: 101 %
FEV6FVC-%Pred-Pre: 101 %
FVC-%Change-Post: 3 %
FVC-%Pred-Post: 119 %
FVC-%Pred-Pre: 115 %
FVC-Post: 4.63 L
FVC-Pre: 4.49 L
Post FEV1/FVC ratio: 74 %
Post FEV6/FVC ratio: 98 %
Pre FEV1/FVC ratio: 75 %
Pre FEV6/FVC Ratio: 99 %
RV % pred: 107 %
RV: 2.09 L
TLC % pred: 101 %
TLC: 6.69 L

## 2020-03-28 MED ORDER — ALBUTEROL SULFATE HFA 108 (90 BASE) MCG/ACT IN AERS: INHALATION_SPRAY | RESPIRATORY_TRACT | 3 refills | Status: DC

## 2020-03-28 MED ORDER — BREZTRI AEROSPHERE 160-9-4.8 MCG/ACT IN AERO: 2.0000 | INHALATION_SPRAY | Freq: Two times a day (BID) | RESPIRATORY_TRACT | 5 refills | Status: AC

## 2020-03-28 NOTE — Progress Notes (Signed)
@Patient  ID: , male    DOB: [birth year], 54 y.o.   MRN: [id number]  Chief Complaint  Patient presents with  . Follow-up    SOB at all times, productive cough with clear mucus as well as whezzing     Referring provider: [facility], [facility]  HPI:  54 year old male who presents for follow-up of COPD/chronic bronchitis.  Past medical history: Migraine Smoking history: Former smoker.  Quit December/2020.  84-pack-year smoking history. Maintenance: Breztri Patient of: Dr. January/2021   03/28/2020  - Visit   [name] is a 54 year old male former smoker and prior COVID-19 infection who presents for follow-up of PFTs.  He is compliant with his 44 however still requires albuterol inhaler or nebulizers at least twice a day for shortness of breath and wheezing. His has daily productive cough. Minimal activity and laying flat worsens his symptoms. He has had two exacerbations in the last 12 months requiring ED visits.  Questionaires / Pulmonary Flowsheets:   MMRC: mMRC Dyspnea Scale mMRC Score  04/01/2020 2  02/29/2020 1  01/29/2020 1    Tests:   01/18/2020-SARS-CoV-2-detected  01/18/2020-chest x-ray-right lower lobe airspace disease consistent with bronchopneumonia  01/25/2020-chest x-ray-improved aeration of right lower lobe, no new focal airspace disease, hilar prominence bilaterally question nodes, suggest short interval radiographic follow-up   FENO:  No results found for: NITRICOXIDE  PFT: PFT Results Latest Ref Rng & Units 03/28/2020  FVC-Pre L 4.49  FVC-Predicted Pre % 115  FVC-Post L 4.63  FVC-Predicted Post % 119  Pre FEV1/FVC % % 75  Post FEV1/FCV % % 74  FEV1-Pre L 3.37  FEV1-Predicted Pre % 108  FEV1-Post L 3.43  DLCO UNC% % 94  DLCO COR %Predicted % 108  TLC L 6.69  TLC % Predicted % 101  RV % Predicted % 107    WALK:  SIX MIN WALK 02/29/2020 01/29/2020  Medications NONE -  Supplimental Oxygen during Test? (L/min) No No   Laps 13 -  Partial Lap (in Meters) 0 -  Baseline BP (sitting) 122/86 -  Baseline Heartrate 78 -  Baseline Dyspnea (Borg Scale) 4 -  Baseline Fatigue (Borg Scale) 1 -  Baseline SPO2 96 -  BP (sitting) 132/80 -  Heartrate 94 -  Dyspnea (Borg Scale) 4 -  Fatigue (Borg Scale) 1 -  SPO2 96 -  BP (sitting) 130/80 -  Heartrate 85 -  SPO2 98 -  Stopped or Paused before Six Minutes No -  Interpretation Dizziness;Leg pain -  Distance Completed 442 -  Distance Completed 0 -  Tech Comments: Patient walked average pace and completed entire 6 min walk without stopping. No oxygen was needed. Patient was able to complete 2 laps at a steady pace. He did have to stop after the 2nd lap due to increased chest tightness and fatigue. Denied any chest pain after walk. No O2 was needed during or after walk.    Imaging: DG Chest 2 View  Result Date: 02/29/2020 CLINICAL DATA:  Follow-up COVID infection EXAM: CHEST - 2 VIEW COMPARISON:  01/25/2020, 01/18/2020, 10/20/2019 FINDINGS: Clearing of previously noted right lower lobe opacity. No acute consolidation, pleural effusion or pneumothorax. Normal cardiomediastinal silhouette. Decreased hilar prominence. No pneumothorax. IMPRESSION: No active cardiopulmonary disease. Electronically Signed   By: 10/22/2019 M.D.   On: 02/29/2020 15:39      Past Medical History:  Diagnosis Date  . COPD (chronic obstructive pulmonary disease) (HCC)   .  Migraine   . Migraine with status migrainosus 02/26/2013  . Pneumonia     Outpatient Encounter Medications as of 03/28/2020  Medication Sig  . albuterol (VENTOLIN HFA) 108 (90 Base) MCG/ACT inhaler TAKE 2 PUFFS BY MOUTH EVERY 6 HOURS AS NEEDED FOR WHEEZE OR SHORTNESS OF BREATH  . Budeson-Glycopyrrol-Formoterol (BREZTRI AEROSPHERE) 160-9-4.8 MCG/ACT AERO Inhale 2 puffs into the lungs 2 (two) times daily.  Marland Kitchen ipratropium-albuterol (DUONEB) 0.5-2.5 (3) MG/3ML SOLN TAKE 3 MLS BY NEBULIZATION EVERY 6 (SIX) HOURS AS NEEDED.   Marland Kitchen Promethazine-DM 6.25-15 MG/5ML SOLN Take 5 mLs by mouth every 6 (six) hours as needed.  . [DISCONTINUED] albuterol (VENTOLIN HFA) 108 (90 Base) MCG/ACT inhaler TAKE 2 PUFFS BY MOUTH EVERY 6 HOURS AS NEEDED FOR WHEEZE OR SHORTNESS OF BREATH  . [DISCONTINUED] Budeson-Glycopyrrol-Formoterol (BREZTRI AEROSPHERE) 160-9-4.8 MCG/ACT AERO Inhale 2 puffs into the lungs 2 (two) times daily.  . benzonatate (TESSALON) 100 MG capsule Take 1-2 capsules (100-200 mg total) by mouth 3 (three) times daily as needed for cough. (Patient not taking: Reported on 03/28/2020)   No facility-administered encounter medications on file as of 03/28/2020.     Review of Systems  Review of Systems  Constitutional: Negative for chills, diaphoresis and fever.  HENT: Negative for congestion.   Respiratory: Positive for cough, shortness of breath and wheezing.   Cardiovascular: Negative for chest pain, palpitations and leg swelling.     Physical Exam  BP 130/74 (BP Location: Left Arm, Cuff Size: Normal)   Pulse 72   Temp 97.7 F (36.5 C) (Temporal)   Ht 5\' 9"  (1.753 m)   Wt 218 lb 3.2 oz (99 kg)   SpO2 97%   BMI 32.22 kg/m   Physical Exam: General: Well-appearing, no acute distress HENT: [place], AT Eyes: EOMI, no scleral icterus Respiratory: Clear to auscultation bilaterally.  No crackles, wheezing or rales Cardiovascular: RRR, -M/R/G, no JVD Extremities:-Edema,-tenderness Neuro: AAO x4, CNII-XII grossly intact Skin: Intact, no rashes or bruising Psych: Normal mood, normal affect  Assessment & Plan:   COPD with chronic bronchitis, not in active exacerbation Stage I (FEV1 110%) GOLD Class D --CONTINUE Breztri TWO puffs TWICE a day --REFILL albuterol. Take as needed for shortness of breath or wheezing --REFER to Pulmonary Rehab  Return in about 6 months (around 09/27/2020).  Rodman Pickle, M.D. [facility] 03/28/2020 4:43 PM

## 2020-03-28 NOTE — Patient Instructions (Addendum)
COPD with chronic bronchitis GOLD Class D --CONTINUE Breztri TWO puffs TWICE a day --REFILL albuterol. Take as needed for shortness of breath or wheezing --REFER to Pulmonary Rehab  Follow-up in 6 months with me

## 2020-03-28 NOTE — Progress Notes (Signed)
PFT done today. 

## 2020-03-31 ENCOUNTER — Encounter (HOSPITAL_COMMUNITY): Payer: Self-pay | Admitting: *Deleted

## 2020-03-31 NOTE — Progress Notes (Signed)
Received referral from DrEverardo All for this pt to participate in pulmonary rehab with the the diagnosis of COPD. Dr. Everardo All to provide staging as his most recent PFT does not contain FEV1 post predicted.  Pt with multiple hospitalizations and increase in severity  despite optimal therapies  Clinical review of pt follow up appt on 4/26 Pulmonary office note Pt positive for Covid 01/2020.  Pt with Covid Risk Score - 3. Pt appropriate for scheduling for Pulmonary rehab.  Will forward to support staff for verification of insurance eligibility/benefits and pulmonary rehab staff for scheduling with pt consent. Alanson Aly, BSN Cardiac and Emergency planning/management officer

## 2020-04-04 ENCOUNTER — Telehealth (HOSPITAL_COMMUNITY): Payer: Self-pay

## 2020-04-04 NOTE — Telephone Encounter (Signed)
Pt insurance is active and benefits verified through [employer] Co-pay 0, DED $100/$100 met, out of pocket $1,000/$94.39 met, co-insurance 20%. no pre-authorization required. Passport, [place]/BCBS 04/04/2020'@8'$ :33am, REF# [id number]

## 2020-04-06 ENCOUNTER — Telehealth (HOSPITAL_COMMUNITY): Payer: Self-pay | Admitting: *Deleted

## 2020-04-07 NOTE — Progress Notes (Signed)
LVM informing the pt not show up for covid testing as he tested for covid on Fri 5/7 due to pt testing + for covid on 01/20/20. Based on the guidelines the pt is in the 90 day window to not retest. The pt is still expected to quarantine until their procedure. Therefore, the pt can still have the scheduled procedure. These are the guidelines as follows:  Guidance: Patient previously tested + COVID; now past 90 day window seeking elective surgery (asymptomatic)  Retest patient If negative, proceed with surgery If positive, postpone surgery for 10 days from positive test Patient to quarantine for the (10 days) Do not retest again prior to surgery (even if scheduled a couple of weeks out) Use standard precautions for surgery

## 2020-04-08 ENCOUNTER — Inpatient Hospital Stay (HOSPITAL_COMMUNITY)
Admission: RE | Admit: 2020-04-08 | Discharge: 2020-04-08 | Disposition: A | Discharge status: Home / Self Care | Payer: BC Managed Care – PPO | Source: Ambulatory Visit

## 2020-04-09 ENCOUNTER — Other Ambulatory Visit (HOSPITAL_COMMUNITY): Payer: BC Managed Care – PPO

## 2020-04-13 ENCOUNTER — Ambulatory Visit: Payer: BC Managed Care – PPO | Admitting: Pulmonary Disease

## 2020-07-15 ENCOUNTER — Ambulatory Visit (INDEPENDENT_AMBULATORY_CARE_PROVIDER_SITE_OTHER): Payer: BC Managed Care – PPO

## 2020-07-15 ENCOUNTER — Other Ambulatory Visit: Payer: Self-pay

## 2020-07-15 ENCOUNTER — Ambulatory Visit
Admission: EM | Admit: 2020-07-15 | Discharge: 2020-07-15 | Disposition: A | Discharge status: Home / Self Care | Payer: BC Managed Care – PPO | Attending: Emergency Medicine | Admitting: Emergency Medicine

## 2020-07-15 DIAGNOSIS — R0781 Pleurodynia: Secondary | ICD-10-CM | POA: Diagnosis not present

## 2020-07-15 DIAGNOSIS — R0789 Other chest pain: Secondary | ICD-10-CM | POA: Diagnosis not present

## 2020-07-15 DIAGNOSIS — S299XXA Unspecified injury of thorax, initial encounter: Secondary | ICD-10-CM | POA: Diagnosis not present

## 2020-07-15 MED ORDER — NAPROXEN 500 MG PO TABS
500.0000 mg | ORAL_TABLET | Freq: Two times a day (BID) | ORAL | 0 refills | Status: DC
Start: 2020-07-15 — End: 2020-07-18

## 2020-07-15 MED ORDER — CYCLOBENZAPRINE HCL 5 MG PO TABS: 5.0000 mg | ORAL_TABLET | Freq: Two times a day (BID) | ORAL | 0 refills | Status: AC | PRN

## 2020-07-15 NOTE — ED Triage Notes (Signed)
Pt c/o pain to right ribs after dropping weight bar on chest working out

## 2020-07-15 NOTE — ED Provider Notes (Signed)
[name]    CSN: [id number] Arrival date & time: 07/15/20  1444      History   Chief Complaint Chief Complaint  Patient presents with  . Rib Injury    HPI [name] is a 54 y.o. male presenting for pain to right anterior ribs s/p dropping a 300 pound weight on his chest while working out.  Denies pain with deep inspiration, there are no shortness of breath or palpitations, lightheadedness, nausea, vomiting.  Denies head trauma.  Has not taken anything for this.   Past Medical History:  Diagnosis Date  . COPD (chronic obstructive pulmonary disease) (HCC)   . Migraine   . Migraine with status migrainosus 02/26/2013  . Pneumonia     Patient Active Problem List   Diagnosis Date Noted  . History of COVID-19 01/22/2020  . Former smoker 11/18/2019  . 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 Migraine with status migrainosus 02/26/2013    Past Surgical History:  Procedure Laterality Date  . CHOLECYSTECTOMY  2013  . FINGER SURGERY    . ROTATOR CUFF REPAIR Right 2013  . STOMACH SURGERY         Home Medications    Prior to Admission medications   Medication Sig Start Date End Date Taking? Authorizing Provider  albuterol (VENTOLIN HFA) 108 (90 Base) MCG/ACT inhaler TAKE 2 PUFFS BY MOUTH EVERY 6 HOURS AS NEEDED FOR WHEEZE OR SHORTNESS OF BREATH 03/28/20   Luciano Cutter, MD  Budeson-Glycopyrrol-Formoterol (BREZTRI AEROSPHERE) 160-9-4.8 MCG/ACT AERO Inhale 2 puffs into the lungs 2 (two) times daily. 03/28/20   Luciano Cutter, MD  cyclobenzaprine (FLEXERIL) 5 MG tablet Take 1 tablet (5 mg total) by mouth 2 (two) times daily as needed for up to 7 days for muscle spasms. 07/15/20 07/22/20  Hall-Potvin, [place], PA-C  ipratropium-albuterol (DUONEB) 0.5-2.5 (3) MG/3ML SOLN TAKE 3 MLS BY NEBULIZATION EVERY 6 (SIX) HOURS AS NEEDED. 02/26/20   Glenford Bayley, NP  naproxen (NAPROSYN) 500 MG tablet Take 1 tablet (500 mg total) by mouth 2 (two) times  daily. 07/15/20   Hall-Potvin, [place], PA-C    Family History Family History  Problem Relation Age of Onset  . Hypertension Mother   . Tuberculosis Mother   . Diabetes Mother   . COPD Mother   . COPD Father   . Cancer Sister   . COPD Brother   . Heart attack Brother     Social History Social History   Tobacco Use  . Smoking status: Former Smoker    Packs/day: 0.50    Years: 42.00    Pack years: 21.00    Types: Cigarettes    Quit date: 01/11/2020    Years since quitting: 0.5  . Smokeless tobacco: Never Used  Vaping Use  . Vaping Use: Never used  Substance Use Topics  . Alcohol use: No  . Drug use: Yes    Types: Marijuana     Allergies   Patient has no known allergies.   Review of Systems As per HPI   Physical Exam Triage Vital Signs ED Triage Vitals  Enc Vitals Group     BP      Pulse      Resp      Temp      Temp src      SpO2      Weight      Height      Head Circumference      Peak Flow  Pain Score      Pain Loc      Pain Edu?      Excl. in GC?    No data found.  Updated Vital Signs BP 116/83   Pulse 74   Temp 98 F (36.7 C)   Resp 16   SpO2 95%   Visual Acuity Right Eye Distance:   Left Eye Distance:   Bilateral Distance:    Right Eye Near:   Left Eye Near:    Bilateral Near:     Physical Exam Constitutional:      General: He is not in acute distress. HENT:     Head: Normocephalic and atraumatic.  Eyes:     General: No scleral icterus.    Pupils: Pupils are equal, round, and reactive to light.  Cardiovascular:     Rate and Rhythm: Normal rate.  Pulmonary:     Effort: Pulmonary effort is normal. No respiratory distress.     Breath sounds: No wheezing.  Chest:    Skin:    Coloration: Skin is not jaundiced or pale.  Neurological:     Mental Status: He is alert and oriented to person, place, and time.      UC Treatments / Results  Labs (all labs ordered are listed, but only abnormal results are  displayed) Labs Reviewed - No data to display  EKG   Radiology DG Ribs Unilateral W/Chest Right  Result Date: 07/15/2020 CLINICAL DATA:  Chest trauma, right rib pain EXAM: RIGHT RIBS AND CHEST - 3+ VIEW COMPARISON:  None. FINDINGS: No fracture or other bone lesions are seen involving the ribs. There is no evidence of pneumothorax or pleural effusion. Both lungs are clear. Heart size and mediastinal contours are within normal limits. IMPRESSION: Negative. Electronically Signed   By: Helyn Numbers MD   On: 07/15/2020 15:37    Procedures Procedures (including critical care time)  Medications Ordered in UC Medications - No data to display  Initial Impression / Assessment and Plan / UC Course  I have reviewed the triage vital signs and the nursing notes.  Pertinent labs & imaging results that were available during my care of the patient were reviewed by me and considered in my medical decision making (see chart for details).     Appears well in office, hemodynamically stable with good oxygenation.  Chest x-ray negative.  Will treat supportively as outlined below.  Return precautions discussed, pt verbalized understanding and is agreeable to plan. Final Clinical Impressions(s) / UC Diagnoses   Final diagnoses:  Rib injury     Discharge Instructions     RICE: rest, ice, compression, elevation as needed for pain.    Heat therapy (hot compress, warm wash rag, hot showers, etc.) can help relax muscles and soothe muscle aches. Cold therapy (ice packs) can be used to help swelling both after injury and after prolonged use of areas of chronic pain/aches.  Pain medication:  500 mg Naprosyn/Aleve (naproxen) every 12 hours with food:  AVOID other NSAIDs while taking this (may have Tylenol).  May take muscle relaxer as needed for severe pain / spasm.  (This medication may cause you to become tired so it is important you do not drink alcohol or operate heavy machinery while on this  medication.  Recommend your first dose to be taken before bedtime to monitor for side effects safely)  Important to follow up with specialist(s) below for further evaluation/management if your symptoms persist or worsen.    ED Prescriptions  Medication Sig Dispense Auth. Provider   naproxen (NAPROSYN) 500 MG tablet Take 1 tablet (500 mg total) by mouth 2 (two) times daily. 30 tablet Hall-Potvin, [place], PA-C   cyclobenzaprine (FLEXERIL) 5 MG tablet Take 1 tablet (5 mg total) by mouth 2 (two) times daily as needed for up to 7 days for muscle spasms. 14 tablet Hall-Potvin, [place], PA-C     I have reviewed the PDMP during this encounter.   Hall-Potvin, [place], [place] 07/15/20 1544

## 2020-07-15 NOTE — Discharge Instructions (Addendum)

## 2020-07-18 ENCOUNTER — Ambulatory Visit
Admission: EM | Admit: 2020-07-18 | Discharge: 2020-07-18 | Disposition: A | Discharge status: Home / Self Care | Payer: BC Managed Care – PPO | Attending: Physician Assistant | Admitting: Physician Assistant

## 2020-07-18 DIAGNOSIS — R0781 Pleurodynia: Secondary | ICD-10-CM

## 2020-07-18 MED ORDER — KETOROLAC TROMETHAMINE 15 MG/ML IJ SOLN
15.0000 mg | Freq: Once | INTRAMUSCULAR | Status: AC
  Administered 2020-07-18: 15 mg via INTRAMUSCULAR

## 2020-07-18 MED ORDER — TRAMADOL HCL 50 MG PO TABS: 50.0000 mg | ORAL_TABLET | Freq: Two times a day (BID) | ORAL | 0 refills | Status: DC | PRN

## 2020-07-18 MED ORDER — DICLOFENAC SODIUM 50 MG PO TBEC
50.0000 mg | DELAYED_RELEASE_TABLET | Freq: Two times a day (BID) | ORAL | 0 refills | Status: DC
Start: 2020-07-18 — End: 2021-04-24

## 2020-07-18 NOTE — Discharge Instructions (Signed)
Toradol injection in office today. Discontinue naproxen, and switch to diclofenac. Tylenol 1000mg  three times a day. Add tramadol as needed for further pain relief. Remember to do deep breathing to prevent pneumonia. Follow up with PCP for recheck. If with sudden shortness of breath, fever, go to the emergency department or further evaluation.

## 2020-07-18 NOTE — ED Triage Notes (Signed)
Pt states dropped a weight bar on rt ribs/side on last Friday. States was seen here and had neg x-rays. Pt states pain has got worse, pain on inspiration coughing, and on movement. States using meds prescribed and ice with no relief.

## 2020-07-18 NOTE — ED Provider Notes (Signed)
[name]    CSN: [id number] Arrival date & time: 07/18/20  1413      History   Chief Complaint Chief Complaint  Patient presents with  . rib injury    HPI [name] is a 54 y.o. male.   54 year old male comes in for worsening rib pain after injury 3 days ago. At the time, injured while bench pressing with 300+ lb impacted right ribs. Was seen at the time with negative xrays. Provided naproxen and flexeril without relief. Since then, has had worsening of pain, especially with movement, cough. States cough at baseline due to COPD, no worsening of cough. Denies fever, shortness of breath.      Past Medical History:  Diagnosis Date  . COPD (chronic obstructive pulmonary disease) (HCC)   . Migraine   . Migraine with status migrainosus 02/26/2013  . Pneumonia     Patient Active Problem List   Diagnosis Date Noted  . History of COVID-19 01/22/2020  . Former smoker 11/18/2019  . 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 Migraine with status migrainosus 02/26/2013    Past Surgical History:  Procedure Laterality Date  . CHOLECYSTECTOMY  2013  . FINGER SURGERY    . ROTATOR CUFF REPAIR Right 2013  . STOMACH SURGERY       Home Medications    Prior to Admission medications   Medication Sig Start Date End Date Taking? Authorizing Provider  albuterol (VENTOLIN HFA) 108 (90 Base) MCG/ACT inhaler TAKE 2 PUFFS BY MOUTH EVERY 6 HOURS AS NEEDED FOR WHEEZE OR SHORTNESS OF BREATH 03/28/20   Luciano Cutter, MD  Budeson-Glycopyrrol-Formoterol (BREZTRI AEROSPHERE) 160-9-4.8 MCG/ACT AERO Inhale 2 puffs into the lungs 2 (two) times daily. 03/28/20   Luciano Cutter, MD  cyclobenzaprine (FLEXERIL) 5 MG tablet Take 1 tablet (5 mg total) by mouth 2 (two) times daily as needed for up to 7 days for muscle spasms. 07/15/20 07/22/20  Hall-Potvin, [place], PA-C  diclofenac (VOLTAREN) 50 MG EC tablet Take 1 tablet (50 mg total) by mouth 2 (two) times daily. 07/18/20    Librada Castronovo V, PA-C  ipratropium-albuterol (DUONEB) 0.5-2.5 (3) MG/3ML SOLN TAKE 3 MLS BY NEBULIZATION EVERY 6 (SIX) HOURS AS NEEDED. 02/26/20   Glenford Bayley, NP  traMADol (ULTRAM) 50 MG tablet Take 1 tablet (50 mg total) by mouth every 12 (twelve) hours as needed. 07/18/20   Belinda Fisher, PA-C    Family History Family History  Problem Relation Age of Onset  . Hypertension Mother   . Tuberculosis Mother   . Diabetes Mother   . COPD Mother   . COPD Father   . Cancer Sister   . COPD Brother   . Heart attack Brother     Social History Social History   Tobacco Use  . Smoking status: Former Smoker    Packs/day: 0.50    Years: 42.00    Pack years: 21.00    Types: Cigarettes    Quit date: 01/11/2020    Years since quitting: 0.5  . Smokeless tobacco: Never Used  Vaping Use  . Vaping Use: Never used  Substance Use Topics  . Alcohol use: No  . Drug use: Yes    Types: Marijuana     Allergies   Patient has no known allergies.   Review of Systems Review of Systems  Reason unable to perform ROS: See HPI as above.     Physical Exam Triage Vital Signs ED Triage Vitals  Enc Vitals Group     BP 07/18/20 1603 128/87     Pulse Rate 07/18/20 1603 64     Resp 07/18/20 1603 16     Temp 07/18/20 1603 98.3 F (36.8 C)     Temp Source 07/18/20 1603 Oral     SpO2 07/18/20 1603 94 %     Weight --      Height --      Head Circumference --      Peak Flow --      Pain Score 07/18/20 1615 10     Pain Loc --      Pain Edu? --      Excl. in GC? --    No data found.  Updated Vital Signs BP 128/87 (BP Location: Left Arm)   Pulse 64   Temp 98.3 F (36.8 C) (Oral)   Resp 16   SpO2 94%   Physical Exam Constitutional:      General: He is not in acute distress.    Appearance: Normal appearance. He is well-developed. He is not toxic-appearing or diaphoretic.  HENT:     Head: Normocephalic and atraumatic.  Eyes:     Conjunctiva/sclera: Conjunctivae normal.     Pupils: Pupils  are equal, round, and reactive to light.  Cardiovascular:     Rate and Rhythm: Normal rate and regular rhythm.  Pulmonary:     Effort: Pulmonary effort is normal. No respiratory distress.     Comments: LCTAB Musculoskeletal:     Cervical back: Normal range of motion and neck supple.     Comments: No swelling, erythema, warmth, contusion. Diffuse tenderness to right lower ribs.   Skin:    General: Skin is warm and dry.  Neurological:     Mental Status: He is alert and oriented to person, place, and time.      UC Treatments / Results  Labs (all labs ordered are listed, but only abnormal results are displayed) Labs Reviewed - No data to display  EKG   Radiology No results found.  Procedures Procedures (including critical care time)  Medications Ordered in UC Medications  ketorolac (TORADOL) 15 MG/ML injection 15 mg (15 mg Intramuscular Given 07/18/20 1647)    Initial Impression / Assessment and Plan / UC Course  I have reviewed the triage vital signs and the nursing notes.  Pertinent labs & imaging results that were available during my care of the patient were reviewed by me and considered in my medical decision making (see chart for details).    Patient with baseline cough without worsening. LCTAB without fever, shob, low suspicion for pneumonia. Will provide further symptomatic management with toradol injection. Continue NSAIDs with tylenol. Short course of tramadol as needed. Discussed complications with pneumonia and reminded of deep breathing. Expected course of healing discussed. Return precautions given.  Final Clinical Impressions(s) / UC Diagnoses   Final diagnoses:  Rib pain on right side   ED Prescriptions    Medication Sig Dispense Auth. Provider   diclofenac (VOLTAREN) 50 MG EC tablet Take 1 tablet (50 mg total) by mouth 2 (two) times daily. 20 tablet Frannie Shedrick V, PA-C   traMADol (ULTRAM) 50 MG tablet Take 1 tablet (50 mg total) by mouth every 12 (twelve)  hours as needed. 10 tablet Belinda Fisher, PA-C     I have reviewed the PDMP during this encounter.   Belinda Fisher, PA-C 07/18/20 2232

## 2020-08-02 ENCOUNTER — Encounter: Payer: Self-pay | Admitting: Emergency Medicine

## 2020-08-02 ENCOUNTER — Ambulatory Visit
Admission: EM | Admit: 2020-08-02 | Discharge: 2020-08-02 | Disposition: A | Discharge status: Home / Self Care | Payer: BC Managed Care – PPO | Attending: Emergency Medicine | Admitting: Emergency Medicine

## 2020-08-02 ENCOUNTER — Other Ambulatory Visit: Payer: Self-pay

## 2020-08-02 ENCOUNTER — Ambulatory Visit (INDEPENDENT_AMBULATORY_CARE_PROVIDER_SITE_OTHER): Payer: BC Managed Care – PPO

## 2020-08-02 DIAGNOSIS — M25421 Effusion, right elbow: Secondary | ICD-10-CM

## 2020-08-02 DIAGNOSIS — M7021 Olecranon bursitis, right elbow: Secondary | ICD-10-CM

## 2020-08-02 MED ORDER — DICLOFENAC SODIUM 1 % EX GEL: 2.0000 g | Freq: Four times a day (QID) | CUTANEOUS | 0 refills | Status: DC

## 2020-08-02 MED ORDER — KETOROLAC TROMETHAMINE 30 MG/ML IJ SOLN
30.0000 mg | Freq: Once | INTRAMUSCULAR | Status: AC
  Administered 2020-08-02: 30 mg via INTRAMUSCULAR

## 2020-08-02 NOTE — Discharge Instructions (Signed)
Apply diclofenac to elbow ACE wrap for compression Follow up if not improving

## 2020-08-02 NOTE — ED Triage Notes (Signed)
Pt sts right elbow pain with swelling x 2 days; pt denies obvious injury

## 2020-08-03 NOTE — ED Provider Notes (Signed)
[name]    CSN: [id number] Arrival date & time: 08/02/20  1734      History   Chief Complaint Chief Complaint  Patient presents with  . Elbow Pain    HPI [name] is a 54 y.o. male presenting today for evaluation of right elbow pain and swelling.  Patient reports over the past few days he has had increased pain and swelling to his right elbow.  He denies difficulty bending although does have some discomfort with this.  He denies any specific injury fall or trauma.  Does report that he is using his arms a lot for his job.  Denies history of similar.  HPI  Past Medical History:  Diagnosis Date  . COPD (chronic obstructive pulmonary disease) (HCC)   . Migraine   . Migraine with status migrainosus 02/26/2013  . Pneumonia     Patient Active Problem List   Diagnosis Date Noted  . History of COVID-19 01/22/2020  . Former smoker 11/18/2019  . 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 Migraine with status migrainosus 02/26/2013    Past Surgical History:  Procedure Laterality Date  . CHOLECYSTECTOMY  2013  . FINGER SURGERY    . ROTATOR CUFF REPAIR Right 2013  . STOMACH SURGERY         Home Medications    Prior to Admission medications   Medication Sig Start Date End Date Taking? Authorizing Provider  albuterol (VENTOLIN HFA) 108 (90 Base) MCG/ACT inhaler TAKE 2 PUFFS BY MOUTH EVERY 6 HOURS AS NEEDED FOR WHEEZE OR SHORTNESS OF BREATH 03/28/20   Luciano Cutter, MD  Budeson-Glycopyrrol-Formoterol (BREZTRI AEROSPHERE) 160-9-4.8 MCG/ACT AERO Inhale 2 puffs into the lungs 2 (two) times daily. 03/28/20   Luciano Cutter, MD  diclofenac (VOLTAREN) 50 MG EC tablet Take 1 tablet (50 mg total) by mouth 2 (two) times daily. 07/18/20   Cathie Hoops, Amy V, PA-C  diclofenac Sodium (VOLTAREN) 1 % GEL Apply 2 g topically 4 (four) times daily. 08/02/20   Fortune Torosian C, PA-C  ipratropium-albuterol (DUONEB) 0.5-2.5 (3) MG/3ML SOLN TAKE 3 MLS BY NEBULIZATION EVERY  6 (SIX) HOURS AS NEEDED. 02/26/20   Glenford Bayley, NP  traMADol (ULTRAM) 50 MG tablet Take 1 tablet (50 mg total) by mouth every 12 (twelve) hours as needed. 07/18/20   Belinda Fisher, PA-C    Family History Family History  Problem Relation Age of Onset  . Hypertension Mother   . Tuberculosis Mother   . Diabetes Mother   . COPD Mother   . COPD Father   . Cancer Sister   . COPD Brother   . Heart attack Brother     Social History Social History   Tobacco Use  . Smoking status: Former Smoker    Packs/day: 0.50    Years: 42.00    Pack years: 21.00    Types: Cigarettes    Quit date: 01/11/2020    Years since quitting: 0.5  . Smokeless tobacco: Never Used  Vaping Use  . Vaping Use: Never used  Substance Use Topics  . Alcohol use: No  . Drug use: Yes    Types: Marijuana     Allergies   Patient has no known allergies.   Review of Systems Review of Systems  Constitutional: Negative for fatigue and fever.  Eyes: Negative for redness, itching and visual disturbance.  Respiratory: Negative for shortness of breath.   Cardiovascular: Negative for chest pain and leg swelling.  Gastrointestinal: Negative for  nausea and vomiting.  Musculoskeletal: Positive for arthralgias and joint swelling. Negative for myalgias.  Skin: Negative for color change, rash and wound.  Neurological: Negative for dizziness, syncope, weakness, light-headedness and headaches.     Physical Exam Triage Vital Signs ED Triage Vitals  Enc Vitals Group     BP 08/02/20 1747 (!) 159/92     Pulse Rate 08/02/20 1747 71     Resp 08/02/20 1747 16     Temp 08/02/20 1747 98.6 F (37 C)     Temp src --      SpO2 08/02/20 1747 97 %     Weight --      Height --      Head Circumference --      Peak Flow --      Pain Score 08/02/20 1808 10     Pain Loc --      Pain Edu? --      Excl. in GC? --    No data found.  Updated Vital Signs BP (!) 159/92 (BP Location: Left Arm)   Pulse 71   Temp 98.6 F (37  C)   Resp 16   SpO2 97%   Visual Acuity Right Eye Distance:   Left Eye Distance:   Bilateral Distance:    Right Eye Near:   Left Eye Near:    Bilateral Near:     Physical Exam Vitals and nursing note reviewed.  Constitutional:      Appearance: He is well-developed.     Comments: No acute distress  HENT:     Head: Normocephalic and atraumatic.     Nose: Nose normal.  Eyes:     Conjunctiva/sclera: Conjunctivae normal.  Cardiovascular:     Rate and Rhythm: Normal rate.  Pulmonary:     Effort: Pulmonary effort is normal. No respiratory distress.  Abdominal:     General: There is no distension.  Musculoskeletal:        General: Normal range of motion.     Cervical back: Neck supple.     Comments: Right elbow: Mild swelling noted over olecranon area, no overlying erythema or warmth, full active range of motion of elbow, radial pulse 2+  Skin:    General: Skin is warm and dry.  Neurological:     Mental Status: He is alert and oriented to person, place, and time.      UC Treatments / Results  Labs (all labs ordered are listed, but only abnormal results are displayed) Labs Reviewed - No data to display  EKG   Radiology DG ELBOW COMPLETE RIGHT (3+VIEW)  Result Date: 08/02/2020 CLINICAL DATA:  Swelling EXAM: RIGHT ELBOW - COMPLETE 3+ VIEW COMPARISON:  06/17/2013 FINDINGS: No fracture or malalignment. No significant elbow effusion. Prominent soft tissue swelling over the olecranon. IMPRESSION: Prominent soft tissue swelling over the olecranon. No acute osseous abnormality. Electronically Signed   By: Jasmine Pang M.D.   On: 08/02/2020 18:54    Procedures Procedures (including critical care time)  Medications Ordered in UC Medications  ketorolac (TORADOL) 30 MG/ML injection 30 mg (30 mg Intramuscular Given 08/02/20 1910)    Initial Impression / Assessment and Plan / UC Course  I have reviewed the triage vital signs and the nursing notes.  Pertinent labs & imaging  results that were available during my care of the patient were reviewed by me and considered in my medical decision making (see chart for details).     Exam consistent with olecranon bursitis, recommending anti-inflammatories  and Ace wrap.  Patient reports he will not comply with oral medicines.  Providing diclofenac topically and Toradol prior to discharge.  Exam seems less consistent with gout at this time.  Discussed strict return precautions. Patient verbalized understanding and is agreeable with plan.  Final Clinical Impressions(s) / UC Diagnoses   Final diagnoses:  Olecranon bursitis of right elbow     Discharge Instructions     Apply diclofenac to elbow ACE wrap for compression Follow up if not improving   ED Prescriptions    Medication Sig Dispense Auth. Provider   diclofenac Sodium (VOLTAREN) 1 % GEL Apply 2 g topically 4 (four) times daily. 100 g Mohamad Bruso, [place] C, PA-C     PDMP not reviewed this encounter.   Mamadou Breon, [place] C, PA-C 08/03/20 1226

## 2020-11-24 ENCOUNTER — Other Ambulatory Visit: Payer: Self-pay | Admitting: Pulmonary Disease

## 2020-11-24 DIAGNOSIS — J449 Chronic obstructive pulmonary disease, unspecified: Secondary | ICD-10-CM

## 2021-03-03 ENCOUNTER — Other Ambulatory Visit: Payer: Self-pay

## 2021-03-03 ENCOUNTER — Ambulatory Visit
Admission: EM | Admit: 2021-03-03 | Discharge: 2021-03-03 | Disposition: A | Discharge status: Home / Self Care | Payer: BC Managed Care – PPO | Attending: Urgent Care | Admitting: Urgent Care

## 2021-03-03 DIAGNOSIS — G8929 Other chronic pain: Secondary | ICD-10-CM

## 2021-03-03 DIAGNOSIS — R519 Headache, unspecified: Secondary | ICD-10-CM | POA: Diagnosis not present

## 2021-03-03 DIAGNOSIS — J3089 Other allergic rhinitis: Secondary | ICD-10-CM

## 2021-03-03 DIAGNOSIS — G4700 Insomnia, unspecified: Secondary | ICD-10-CM

## 2021-03-03 DIAGNOSIS — Z87828 Personal history of other (healed) physical injury and trauma: Secondary | ICD-10-CM

## 2021-03-03 MED ORDER — CETIRIZINE HCL 10 MG PO TABS
10.0000 mg | ORAL_TABLET | Freq: Every day | ORAL | 0 refills | Status: DC
Start: 2021-03-03 — End: 2021-04-24

## 2021-03-03 MED ORDER — FLUTICASONE PROPIONATE 50 MCG/ACT NA SUSP
2.0000 | Freq: Every day | NASAL | 0 refills | Status: DC
Start: 2021-03-03 — End: 2021-04-24

## 2021-03-03 MED ORDER — HYDROXYZINE HCL 25 MG PO TABS
25.0000 mg | ORAL_TABLET | Freq: Every evening | ORAL | 0 refills | Status: DC | PRN
Start: 2021-03-03 — End: 2021-04-24

## 2021-03-03 MED ORDER — NAPROXEN 500 MG PO TABS
500.0000 mg | ORAL_TABLET | Freq: Two times a day (BID) | ORAL | 0 refills | Status: DC
Start: 2021-03-03 — End: 2021-04-24

## 2021-03-03 NOTE — ED Triage Notes (Signed)
Pt presents with headache for past week

## 2021-03-03 NOTE — ED Provider Notes (Signed)
[facility]   MRN: [id number] DOB: 03/08/1966  Subjective:   [name] is a 55 y.o. male presenting for 1 week history of acute on chronic persistent frontal headache with photophobia.  Patient states that he has a history of chronic migraines and headaches as long as he can remember.  Has previously had an MRI in 2014 which was negative.  Does not get consistent follow-up.  Denies confusion, weakness, slurred speech.  Also reports a history of multiple head traumas.  He works for [employer] and performs strenuous work activity.  States that he drinks 1 to 2 cups of water a day.  Primarily drinks Monster drinks, energy drinks and also does coffee.  Drinks 2+ beers every day.  Reports that he regularly skips meals.  He also does not like to sleep.  States that he previously had a difficult trauma related to his sleep when he was young and has had a difficult time sleeping ever since.  No current facility-administered medications for this encounter.  Current Outpatient Medications:  .  albuterol (VENTOLIN HFA) 108 (90 Base) MCG/ACT inhaler, TAKE 2 PUFFS BY MOUTH EVERY 6 HOURS AS NEEDED FOR WHEEZE OR SHORTNESS OF BREATH, Disp: 8.5 each, Rfl: 3 .  Budeson-Glycopyrrol-Formoterol (BREZTRI AEROSPHERE) 160-9-4.8 MCG/ACT AERO, Inhale 2 puffs into the lungs 2 (two) times daily., Disp: 10.7 g, Rfl: 5 .  diclofenac (VOLTAREN) 50 MG EC tablet, Take 1 tablet (50 mg total) by mouth 2 (two) times daily., Disp: 20 tablet, Rfl: 0 .  diclofenac Sodium (VOLTAREN) 1 % GEL, Apply 2 g topically 4 (four) times daily., Disp: 100 g, Rfl: 0 .  ipratropium-albuterol (DUONEB) 0.5-2.5 (3) MG/3ML SOLN, TAKE 3 MLS BY NEBULIZATION EVERY 6 (SIX) HOURS AS NEEDED., Disp: 360 mL, Rfl: 2 .  traMADol (ULTRAM) 50 MG tablet, Take 1 tablet (50 mg total) by mouth every 12 (twelve) hours as needed., Disp: 10 tablet, Rfl: 0   No Known Allergies  Past Medical History:  Diagnosis Date  . COPD (chronic obstructive pulmonary  disease) (HCC)   . Migraine   . Migraine with status migrainosus 02/26/2013  . Pneumonia      Past Surgical History:  Procedure Laterality Date  . CHOLECYSTECTOMY  2013  . FINGER SURGERY    . ROTATOR CUFF REPAIR Right 2013  . STOMACH SURGERY      Family History  Problem Relation Age of Onset  . Hypertension Mother   . Tuberculosis Mother   . Diabetes Mother   . COPD Mother   . COPD Father   . Cancer Sister   . COPD Brother   . Heart attack Brother     Social History   Tobacco Use  . Smoking status: Former Smoker    Packs/day: 0.50    Years: 42.00    Pack years: 21.00    Types: Cigarettes    Quit date: 01/11/2020    Years since quitting: 1.1  . Smokeless tobacco: Never Used  Vaping Use  . Vaping Use: Never used  Substance Use Topics  . Alcohol use: No  . Drug use: Yes    Types: Marijuana    ROS   Objective:   Vitals: BP (!) 147/68   Temp 98.3 F (36.8 C)   Resp 18   SpO2 98%   Physical Exam Constitutional:      General: He is not in acute distress.    Appearance: Normal appearance. He is well-developed and normal weight. He is not ill-appearing, toxic-appearing or  diaphoretic.  HENT:     Head: Normocephalic and atraumatic.     Right Ear: Tympanic membrane, ear canal and external ear normal. There is no impacted cerumen.     Left Ear: Tympanic membrane, ear canal and external ear normal. There is no impacted cerumen.     Nose: Nose normal. No congestion or rhinorrhea.     Mouth/Throat:     Mouth: Mucous membranes are moist.     Pharynx: Oropharynx is clear. No oropharyngeal exudate or posterior oropharyngeal erythema.  Eyes:     General: No scleral icterus.       Right eye: No discharge.        Left eye: No discharge.     Extraocular Movements: Extraocular movements intact.     Conjunctiva/sclera: Conjunctivae normal.     Pupils: Pupils are equal, round, and reactive to light.  Cardiovascular:     Rate and Rhythm: Normal rate.  Pulmonary:      Effort: Pulmonary effort is normal.  Musculoskeletal:     Cervical back: Normal range of motion and neck supple. No rigidity. No muscular tenderness.  Neurological:     General: No focal deficit present.     Mental Status: He is alert and oriented to person, place, and time.     Cranial Nerves: No cranial nerve deficit.     Motor: No weakness.     Coordination: Coordination normal.     Gait: Gait normal.     Deep Tendon Reflexes: Reflexes normal.  Psychiatric:        Mood and Affect: Mood normal. Mood is not anxious or depressed.        Speech: Speech normal.        Behavior: Behavior normal. Behavior is not agitated.        Thought Content: Thought content normal.        Cognition and Memory: Cognition is not impaired. Memory is not impaired.        Judgment: Judgment normal.     Assessment and Plan :   PDMP not reviewed this encounter.  1. Chronic intractable headache, unspecified headache type   2. History of traumatic head injury   3. Allergic rhinitis due to other allergic trigger, unspecified seasonality   4. Insomnia, unspecified type     Patient has multiple contributing factors for his headaches.  In the urgent care side, discussed general headache management.  He needs to establish care with a specialist through the [facility].  Offered him Toradol but he declined.  Will use naproxen for his headache symptoms.  Recommend starting Zyrtec and Flonase as he has chronic allergic rhinitis and does not take any medications for this.  At this time I do not see any signs on his physical exam of an intracranial medical emergency.  Maintain strict ER precautions. Counseled patient on potential for adverse effects with medications prescribed today, patient verbalized understanding.    Wallis Bamberg, [place] 03/03/21 [id number]

## 2021-03-10 IMAGING — DX DG ELBOW COMPLETE 3+V*R*
4 series · 4 of 4 positions shown · non-contrast
Comparison: 06/17/2013

CLINICAL DATA: Swelling

EXAM:
RIGHT ELBOW - COMPLETE 3+ VIEW

[elbow ap (1 of 3)]
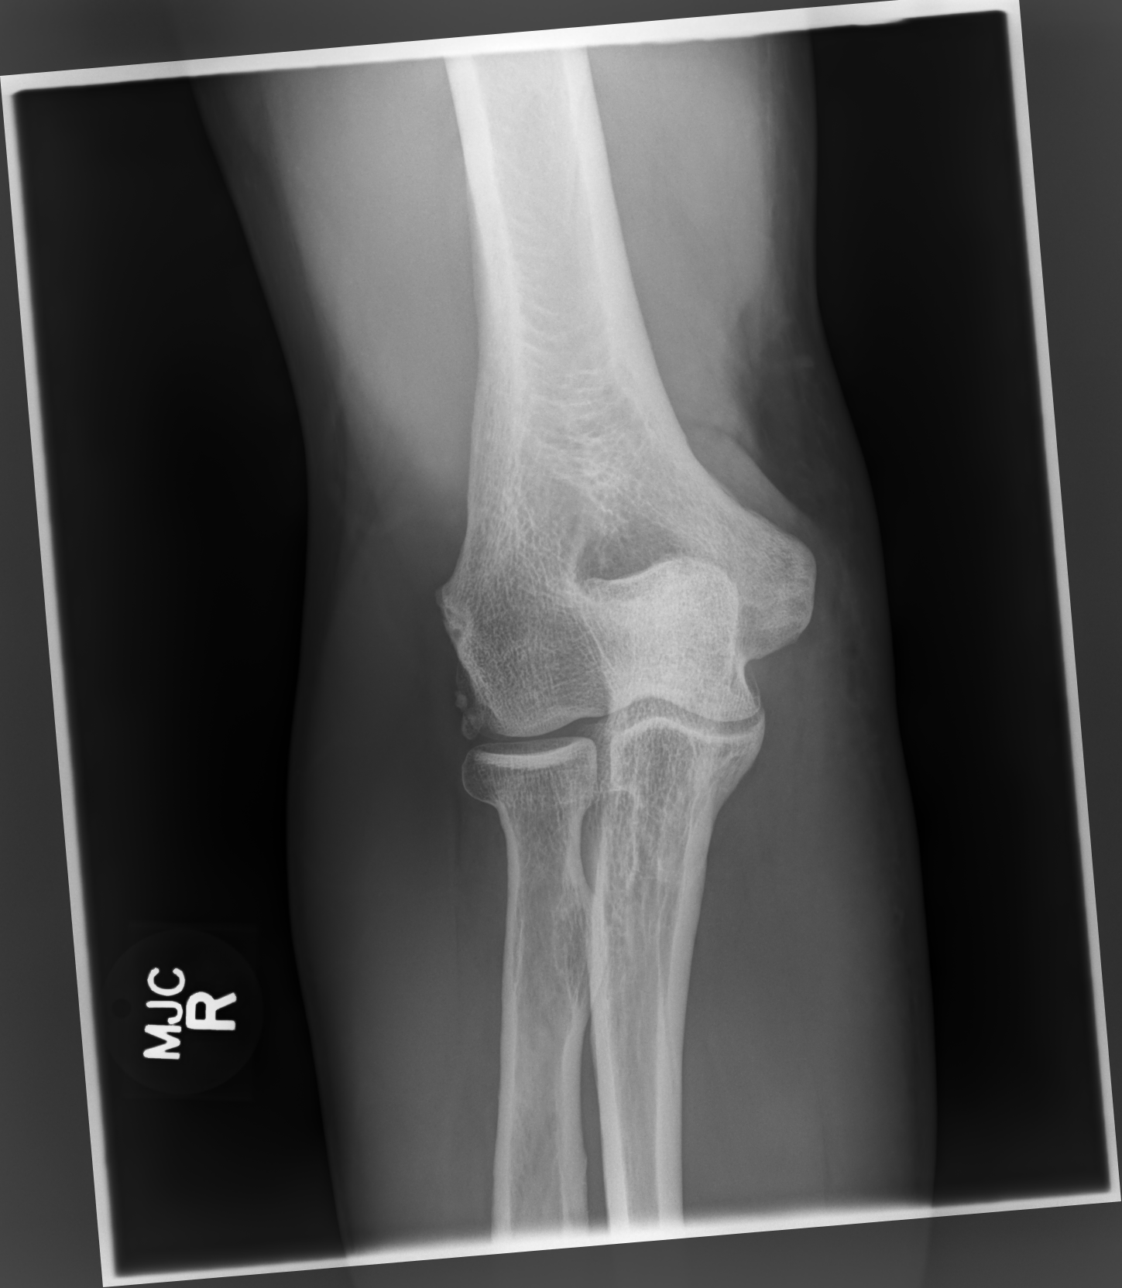

[elbow ap (2 of 3)]
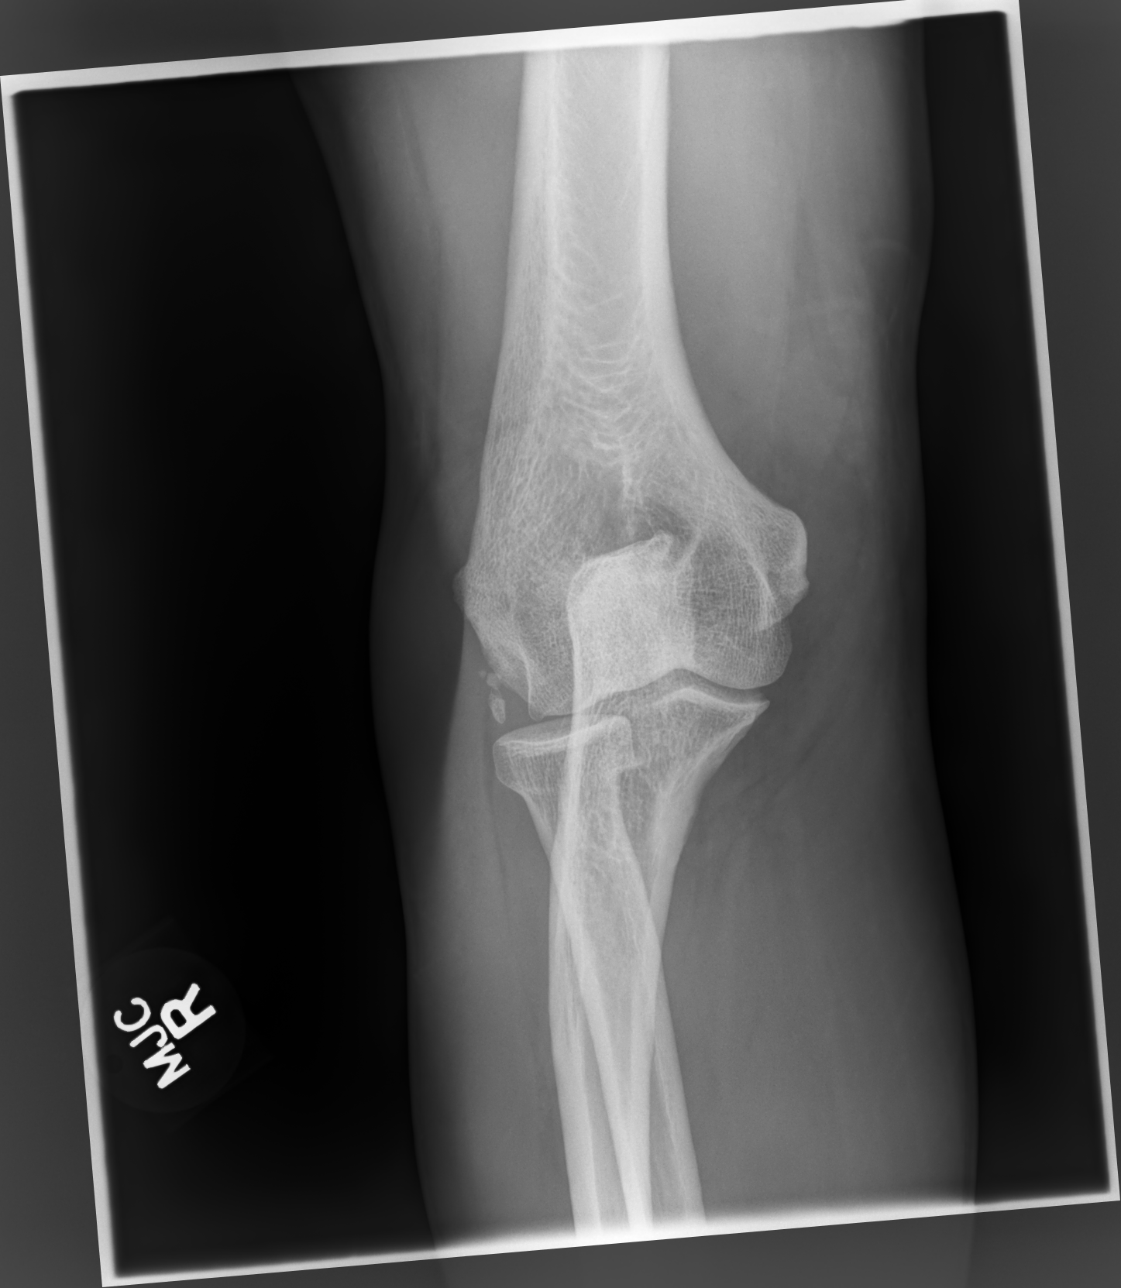

[elbow ap (3 of 3)]
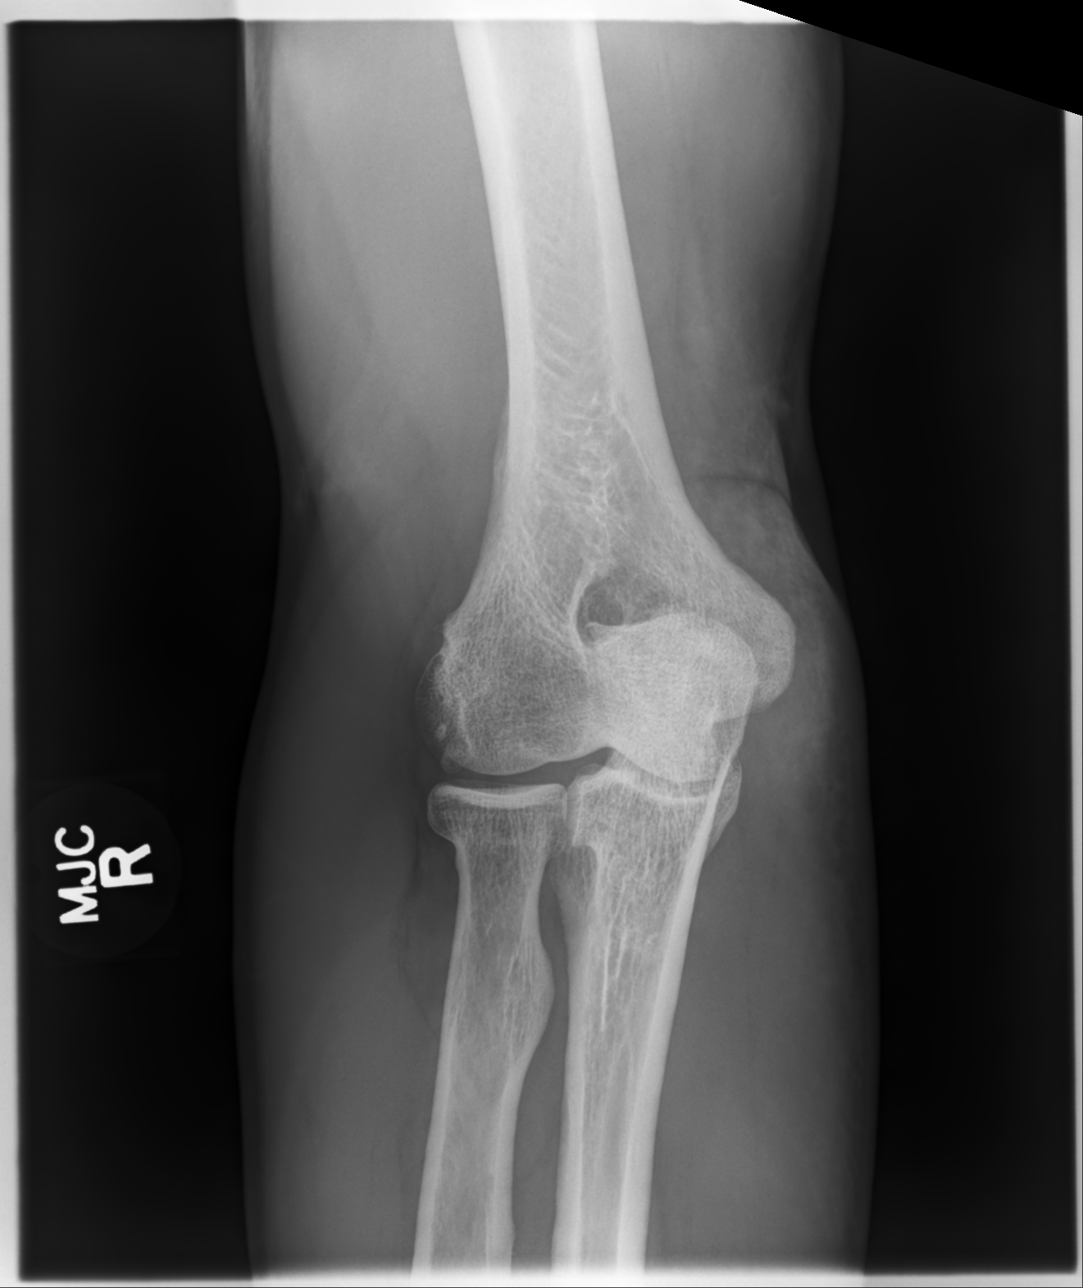

[elbow lat]
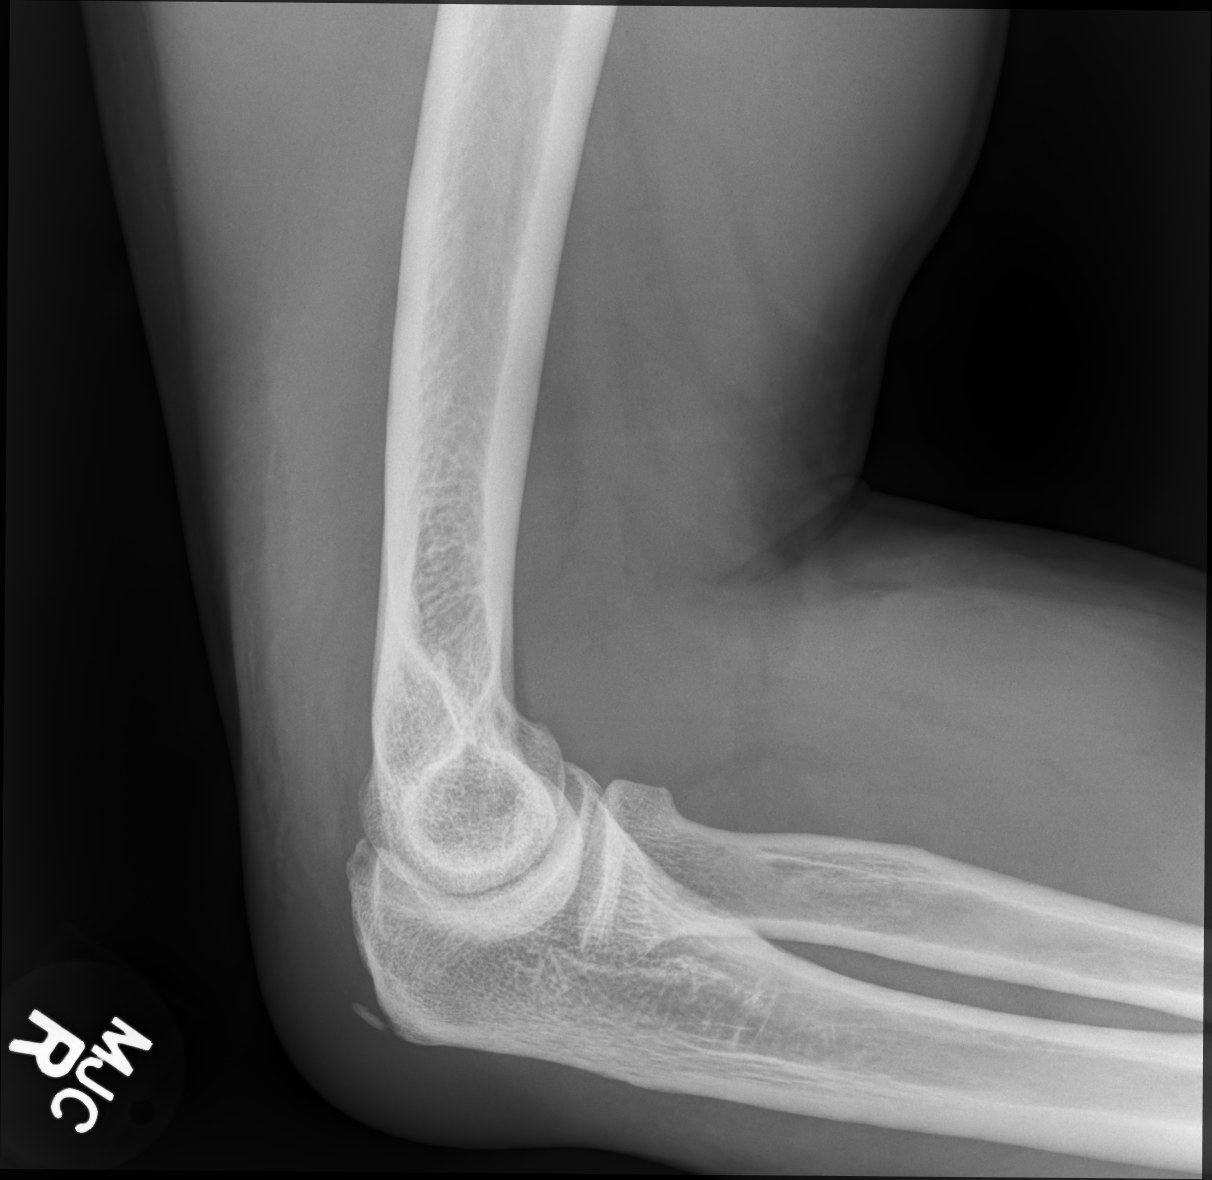

[4 of 4 positions shown; findings below may reference images not displayed]

FINDINGS: No fracture or malalignment. No significant elbow effusion.
Prominent soft tissue swelling over the olecranon.
IMPRESSION: Prominent soft tissue swelling over the olecranon. No acute osseous
abnormality.

## 2021-03-31 ENCOUNTER — Other Ambulatory Visit: Payer: Self-pay | Admitting: Pulmonary Disease

## 2021-03-31 DIAGNOSIS — J449 Chronic obstructive pulmonary disease, unspecified: Secondary | ICD-10-CM

## 2021-04-23 ENCOUNTER — Encounter (HOSPITAL_COMMUNITY): Payer: Self-pay | Admitting: Emergency Medicine

## 2021-04-23 ENCOUNTER — Other Ambulatory Visit: Payer: Self-pay

## 2021-04-23 ENCOUNTER — Inpatient Hospital Stay (HOSPITAL_COMMUNITY)
Admission: EM | Admit: 2021-04-23 | Discharge: 2021-04-26 | DRG: 193 | Disposition: A | Discharge status: Home / Self Care | Payer: BC Managed Care – PPO | Attending: Student | Admitting: Student

## 2021-04-23 ENCOUNTER — Emergency Department (HOSPITAL_COMMUNITY): Payer: BC Managed Care – PPO

## 2021-04-23 DIAGNOSIS — J440 Chronic obstructive pulmonary disease with acute lower respiratory infection: Secondary | ICD-10-CM | POA: Diagnosis present

## 2021-04-23 DIAGNOSIS — J441 Chronic obstructive pulmonary disease with (acute) exacerbation: Secondary | ICD-10-CM | POA: Diagnosis present

## 2021-04-23 DIAGNOSIS — J9601 Acute respiratory failure with hypoxia: Secondary | ICD-10-CM | POA: Diagnosis present

## 2021-04-23 DIAGNOSIS — G43909 Migraine, unspecified, not intractable, without status migrainosus: Secondary | ICD-10-CM | POA: Diagnosis present

## 2021-04-23 DIAGNOSIS — R031 Nonspecific low blood-pressure reading: Secondary | ICD-10-CM | POA: Diagnosis present

## 2021-04-23 DIAGNOSIS — D72829 Elevated white blood cell count, unspecified: Secondary | ICD-10-CM | POA: Diagnosis present

## 2021-04-23 DIAGNOSIS — F17210 Nicotine dependence, cigarettes, uncomplicated: Secondary | ICD-10-CM | POA: Diagnosis present

## 2021-04-23 DIAGNOSIS — J189 Pneumonia, unspecified organism: Secondary | ICD-10-CM | POA: Diagnosis not present

## 2021-04-23 DIAGNOSIS — Z8249 Family history of ischemic heart disease and other diseases of the circulatory system: Secondary | ICD-10-CM

## 2021-04-23 DIAGNOSIS — F1010 Alcohol abuse, uncomplicated: Secondary | ICD-10-CM | POA: Diagnosis present

## 2021-04-23 DIAGNOSIS — T1490XA Injury, unspecified, initial encounter: Secondary | ICD-10-CM | POA: Diagnosis not present

## 2021-04-23 DIAGNOSIS — Z833 Family history of diabetes mellitus: Secondary | ICD-10-CM

## 2021-04-23 DIAGNOSIS — Z809 Family history of malignant neoplasm, unspecified: Secondary | ICD-10-CM

## 2021-04-23 DIAGNOSIS — Z9049 Acquired absence of other specified parts of digestive tract: Secondary | ICD-10-CM

## 2021-04-23 DIAGNOSIS — Z79899 Other long term (current) drug therapy: Secondary | ICD-10-CM

## 2021-04-23 DIAGNOSIS — Z20822 Contact with and (suspected) exposure to covid-19: Secondary | ICD-10-CM | POA: Diagnosis present

## 2021-04-23 DIAGNOSIS — J449 Chronic obstructive pulmonary disease, unspecified: Secondary | ICD-10-CM | POA: Diagnosis present

## 2021-04-23 DIAGNOSIS — Z8616 Personal history of COVID-19: Secondary | ICD-10-CM

## 2021-04-23 DIAGNOSIS — Z825 Family history of asthma and other chronic lower respiratory diseases: Secondary | ICD-10-CM

## 2021-04-23 DIAGNOSIS — R0902 Hypoxemia: Secondary | ICD-10-CM | POA: Diagnosis present

## 2021-04-23 DIAGNOSIS — R0781 Pleurodynia: Secondary | ICD-10-CM

## 2021-04-23 DIAGNOSIS — H547 Unspecified visual loss: Secondary | ICD-10-CM | POA: Diagnosis present

## 2021-04-23 DIAGNOSIS — R17 Unspecified jaundice: Secondary | ICD-10-CM | POA: Diagnosis present

## 2021-04-23 DIAGNOSIS — Z831 Family history of other infectious and parasitic diseases: Secondary | ICD-10-CM

## 2021-04-23 DIAGNOSIS — G43901 Migraine, unspecified, not intractable, with status migrainosus: Secondary | ICD-10-CM | POA: Diagnosis present

## 2021-04-23 DIAGNOSIS — J4489 Other specified chronic obstructive pulmonary disease: Secondary | ICD-10-CM | POA: Diagnosis present

## 2021-04-23 LAB — CBC WITH DIFFERENTIAL/PLATELET
Abs Immature Granulocytes: 0.04 10*3/uL (ref 0.00–0.07)
Basophils Absolute: 0 10*3/uL (ref 0.0–0.1)
Basophils Relative: 0 %
Eosinophils Absolute: 0 10*3/uL (ref 0.0–0.5)
Eosinophils Relative: 0 %
HCT: 44.7 % (ref 39.0–52.0)
Hemoglobin: 15.4 g/dL (ref 13.0–17.0)
Immature Granulocytes: 0 %
Lymphocytes Relative: 13 %
Lymphs Abs: 1.4 10*3/uL (ref 0.7–4.0)
MCH: 32.4 pg (ref 26.0–34.0)
MCHC: 34.5 g/dL (ref 30.0–36.0)
MCV: 94.1 fL (ref 80.0–100.0)
Monocytes Absolute: 0.8 10*3/uL (ref 0.1–1.0)
Monocytes Relative: 7 %
Neutro Abs: 8.6 10*3/uL — ABNORMAL HIGH (ref 1.7–7.7)
Neutrophils Relative %: 80 %
Platelets: 281 10*3/uL (ref 150–400)
RBC: 4.75 MIL/uL (ref 4.22–5.81)
RDW: 13.8 % (ref 11.5–15.5)
WBC: 10.9 10*3/uL — ABNORMAL HIGH (ref 4.0–10.5)
nRBC: 0 % (ref 0.0–0.2)

## 2021-04-23 LAB — COMPREHENSIVE METABOLIC PANEL
ALT: 27 U/L (ref 0–44)
AST: 28 U/L (ref 15–41)
Albumin: 3.6 g/dL (ref 3.5–5.0)
Alkaline Phosphatase: 73 U/L (ref 38–126)
Anion gap: 10 (ref 5–15)
BUN: 12 mg/dL (ref 6–20)
CO2: 26 mmol/L (ref 22–32)
Calcium: 9.3 mg/dL (ref 8.9–10.3)
Chloride: 100 mmol/L (ref 98–111)
Creatinine, Ser: 1.16 mg/dL (ref 0.61–1.24)
GFR, Estimated: >60 mL/min (ref >60)
Glucose, Bld: 122 mg/dL — ABNORMAL HIGH (ref 70–99)
Potassium: 4.8 mmol/L (ref 3.5–5.1)
Sodium: 136 mmol/L (ref 135–145)
Total Bilirubin: 1.8 mg/dL — ABNORMAL HIGH (ref 0.3–1.2)
Total Protein: 7.9 g/dL (ref 6.5–8.1)

## 2021-04-23 LAB — LACTIC ACID, PLASMA: Lactic Acid, Venous: 1.9 mmol/L (ref 0.5–1.9)

## 2021-04-23 LAB — LIPASE, BLOOD: Lipase: 28 U/L (ref 11–51)

## 2021-04-23 LAB — TROPONIN I (HIGH SENSITIVITY): Troponin I (High Sensitivity): 5 ng/L (ref <18)

## 2021-04-23 MED ORDER — ONDANSETRON HCL 4 MG/2ML IJ SOLN
4.0000 mg | Freq: Once | INTRAMUSCULAR | Status: AC
  Administered 2021-04-24: 4 mg via INTRAVENOUS
  Filled 2021-04-23: qty 2

## 2021-04-23 MED ORDER — IPRATROPIUM-ALBUTEROL 0.5-2.5 (3) MG/3ML IN SOLN
3.0000 mL | RESPIRATORY_TRACT | Status: AC
  Administered 2021-04-23 (×3): 3 mL via RESPIRATORY_TRACT
  Filled 2021-04-23: qty 3
  Filled 2021-04-23: qty 6

## 2021-04-23 MED ORDER — IOHEXOL 350 MG/ML SOLN
50.0000 mL | Freq: Once | INTRAVENOUS | Status: AC | PRN
  Administered 2021-04-23: 50 mL via INTRAVENOUS

## 2021-04-23 MED ORDER — MORPHINE SULFATE (PF) 4 MG/ML IV SOLN
6.0000 mg | Freq: Once | INTRAVENOUS | Status: AC
  Administered 2021-04-23: 6 mg via INTRAVENOUS
  Filled 2021-04-23: qty 2

## 2021-04-23 NOTE — ED Provider Notes (Signed)
[facility] EMERGENCY DEPARTMENT Provider Note   CSN: [id number] Arrival date & time: 04/23/21  1612     History Chief Complaint  Patient presents with  . Rib Injury    [name] is a 55 y.o. male.  HPI  Pt presents with severe pain under his ribs. Feels severe.  Started a couple of days ago suddenly.  He said he was laying down and then got up from laying down and had the sudden onset of the severe pain.  It is difficult to breathe and it hurts to breathe in.  He has COPD and is a little bit wheezy but does not feel this is the cause of his symptoms.  No fevers, chills, lightheadedness, passing out.  The pain is well localized in the front of his chest underneath his ribs.  He denies abdominal pain and says he has been eating and drinking.  He has not had a bowel movement in a couple of days.  Continues to urinate.  Denies drug or alcohol use.  He did not come to the hospital because he was thinking the pain would improve.  No personal history of blood clot, family history of blood clot.  No history of heart attacks or heart problems.  He does state that he has had a headache for about a month behind his eyes. He works at [employer] but does not think he suffered any recent injury to his ribs or had heavy strain lifting.    Past Medical History:  Diagnosis Date  . COPD (chronic obstructive pulmonary disease) (HCC)   . Migraine   . Migraine with status migrainosus 02/26/2013  . Pneumonia     Patient Active Problem List   Diagnosis Date Noted  . CAP (community acquired pneumonia) 04/24/2021  . History of COVID-19 01/22/2020  . Former smoker 11/18/2019  . 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 Migraine with status migrainosus 02/26/2013    Past Surgical History:  Procedure Laterality Date  . CHOLECYSTECTOMY  2013  . FINGER SURGERY    . ROTATOR CUFF REPAIR Right 2013  . STOMACH SURGERY         Family History  Problem Relation Age of Onset  .  Hypertension Mother   . Tuberculosis Mother   . Diabetes Mother   . COPD Mother   . COPD Father   . Cancer Sister   . COPD Brother   . Heart attack Brother     Social History   Tobacco Use  . Smoking status: Current Some Day Smoker    Packs/day: 0.50    Years: 42.00    Pack years: 21.00    Types: Cigarettes    Last attempt to quit: 01/11/2020    Years since quitting: 1.2  . Smokeless tobacco: Never Used  . Tobacco comment: Currently only smoking 1 to 2 cigarettes/day on average.  Vaping Use  . Vaping Use: Never used  Substance Use Topics  . Alcohol use: Yes    Alcohol/week: 14.0 standard drinks    Types: 14 Cans of beer per week  . Drug use: Yes    Types: Marijuana    Home Medications Prior to Admission medications   Medication Sig Start Date End Date Taking? Authorizing Provider  acetaminophen (TYLENOL) 325 MG tablet Take 650 mg by mouth every 6 (six) hours as needed for moderate pain.   Yes [provider]  albuterol (VENTOLIN HFA) 108 (90 Base) MCG/ACT inhaler TAKE 2 PUFFS BY MOUTH  EVERY 6 HOURS AS NEEDED FOR WHEEZE OR SHORTNESS OF BREATH Patient taking differently: Inhale 2 puffs into the lungs every 6 (six) hours as needed for wheezing or shortness of breath. 03/31/21  Yes Luciano CutterEllison, Chi Jane, MD  Budeson-Glycopyrrol-Formoterol (BREZTRI AEROSPHERE) 160-9-4.8 MCG/ACT AERO Inhale 2 puffs into the lungs 2 (two) times daily. 03/28/20  Yes Luciano CutterEllison, Chi Jane, MD  ipratropium-albuterol (DUONEB) 0.5-2.5 (3) MG/3ML SOLN TAKE 3 MLS BY NEBULIZATION EVERY 6 (SIX) HOURS AS NEEDED. Patient taking differently: Take 3 mLs by nebulization every 6 (six) hours as needed (shortness of breath / wheezing). 02/26/20  Yes Glenford BayleyWalsh, Elizabeth W, NP    Allergies    Patient has no known allergies.  Review of Systems   Review of Systems  Constitutional: Negative for chills and fever.  HENT: Negative for ear pain and sore throat.   Eyes: Negative for pain and visual disturbance.   Respiratory: Positive for shortness of breath. Negative for cough.   Cardiovascular: Positive for chest pain. Negative for palpitations.  Gastrointestinal: Negative for abdominal pain and vomiting.  Genitourinary: Negative for dysuria and hematuria.  Musculoskeletal: Negative for arthralgias and back pain.  Skin: Negative for color change and rash.  Neurological: Negative for seizures and syncope.  All other systems reviewed and are negative.   Physical Exam Updated Vital Signs BP 108/80 (BP Location: Left Arm)   Pulse 65   Temp 98.4 F (36.9 C) (Oral)   Resp 18   SpO2 94%   Physical Exam Vitals and nursing note reviewed.  Constitutional:      General: He is in acute distress.     Appearance: He is well-developed.  HENT:     Head: Normocephalic and atraumatic.  Eyes:     Extraocular Movements: Extraocular movements intact.     Conjunctiva/sclera: Conjunctivae normal.  Cardiovascular:     Rate and Rhythm: Normal rate and regular rhythm.     Heart sounds: No murmur heard.   Pulmonary:     Breath sounds: Wheezing present.     Comments: Inc WOB tachypneic Abdominal:     Palpations: Abdomen is soft.     Tenderness: There is no abdominal tenderness.  Musculoskeletal:     Cervical back: Normal range of motion and neck supple.     Right lower leg: No edema.     Left lower leg: No edema.     Comments: No chest wall tenderness to palpation.  Skin:    General: Skin is warm and dry.  Neurological:     General: No focal deficit present.     Mental Status: He is alert.     Motor: No weakness.  Psychiatric:        Behavior: Behavior normal.        Thought Content: Thought content normal.     ED Results / Procedures / Treatments   Labs (all labs ordered are listed, but only abnormal results are displayed) Labs Reviewed  CBC WITH DIFFERENTIAL/PLATELET - Abnormal; Notable for the following components:      Result Value   WBC 10.9 (*)    Neutro Abs 8.6 (*)    All other  components within normal limits  COMPREHENSIVE METABOLIC PANEL - Abnormal; Notable for the following components:   Glucose, Bld 122 (*)    Total Bilirubin 1.8 (*)    All other components within normal limits  URINALYSIS, ROUTINE W REFLEX MICROSCOPIC - Abnormal; Notable for the following components:   Color, Urine AMBER (*)    Specific  Gravity, Urine 1.032 (*)    Ketones, ur 5 (*)    All other components within normal limits  RESP PANEL BY RT-PCR (FLU A&B, COVID) ARPGX2  LACTIC ACID, PLASMA  LACTIC ACID, PLASMA  LIPASE, BLOOD  HIV ANTIBODY (ROUTINE TESTING W REFLEX)  PROCALCITONIN  TROPONIN I (HIGH SENSITIVITY)  TROPONIN I (HIGH SENSITIVITY)    EKG EKG Interpretation  Date/Time:  Sunday Apr 23 2021 22:11:14 EDT Ventricular Rate:  94 PR Interval:  165 QRS Duration: 87 QT Interval:  324 QTC Calculation: 406 R Axis:   90 Text Interpretation: Sinus rhythm Borderline right axis deviation Left ventricular hypertrophy  similar to Nov 2020 Confirmed by Pricilla Loveless [phone]) on 04/23/2021 11:25:36 PM Also confirmed by Pricilla Loveless [phone]), editor [place], LaVerne ([id number])  on 04/24/2021 9:02:34 AM   Radiology CT ABDOMEN PELVIS WO CONTRAST  Result Date: 04/24/2021 CLINICAL DATA:  Diverticulitis suspected. Left anterior T7 area pain. EXAM: CT ABDOMEN AND PELVIS WITHOUT CONTRAST TECHNIQUE: Multidetector CT imaging of the abdomen and pelvis was performed following the standard protocol without IV contrast. COMPARISON:  CT angiography chest 04/23/2021 FINDINGS: Lower chest: Interval development of trace bilateral pleural effusions. Bilateral lower lobe atelectasis. Emphysema. Hepatobiliary: No focal liver abnormality is seen. Status post cholecystectomy. No biliary dilatation. Pancreas: No focal lesion. Normal pancreatic contour. No surrounding inflammatory changes. No main pancreatic ductal dilatation. Spleen: Normal in size without focal abnormality. Adrenals/Urinary Tract: No adrenal  nodule bilaterally. Excretion of previously administered intravenous contrast noted bilaterally. 2.1 cm fluid density lesion within left kidney likely represents a simple renal cyst. No nephrolithiasis, no hydronephrosis, and no contour-deforming renal mass. No ureterolithiasis or hydroureter. The urinary bladder is unremarkable. Stomach/Bowel: PO contrast reaches the mid small bowel. Stomach is within normal limits. No evidence of bowel wall thickening or dilatation. Appendix appears normal. Vascular/Lymphatic: No abdominal aorta or iliac aneurysm. Mild atherosclerotic plaque of the aorta and its branches. No abdominal, pelvic, or inguinal lymphadenopathy. Reproductive: Prostate is unremarkable. Other: No intraperitoneal free fluid. No intraperitoneal free gas. No organized fluid collection. Musculoskeletal: No acute or significant osseous findings. IMPRESSION: 1. No acute intra-abdominal or intrapelvic abnormality with limited evaluation due to noncontrast study and motion artifact. 2. Aortic Atherosclerosis (ICD10-I70.0) and Emphysema (ICD10-J43.9). Electronically Signed   By: Tish Frederickson M.D.   On: 04/24/2021 03:57   DG Ribs Unilateral W/Chest Left  Result Date: 04/23/2021 CLINICAL DATA:  Left rib pain for several days. EXAM: LEFT RIBS AND CHEST - 3+ VIEW COMPARISON:  Chest and right rib radiographs 07/15/2020 FINDINGS: No fracture or other bone lesions are seen involving the ribs. A BB was placed at site of pain about lower ribs, no rib abnormality subjacent to the BB. There is no evidence of pneumothorax or pleural effusion. Streaky atelectasis involving the right greater than left lung base. No confluent consolidation. Heart size and mediastinal contours are within normal limits. IMPRESSION: 1. Unremarkable radiographic appearance of left ribs. 2. Streaky bibasilar atelectasis. Electronically Signed   By: Narda Rutherford M.D.   On: 04/23/2021 18:06   CT HEAD WO CONTRAST  Result Date:  04/24/2021 CLINICAL DATA:  Neurologic deficit Migraines Vision changes EXAM: CT HEAD WITHOUT CONTRAST TECHNIQUE: Contiguous axial images were obtained from the base of the skull through the vertex without intravenous contrast. COMPARISON:  None. FINDINGS: Brain: No evidence of acute infarction, hemorrhage, hydrocephalus, extra-axial collection or mass lesion/mass effect. Vascular: No hyperdense vessel or unexpected calcification. Skull: Normal. Negative for fracture or focal lesion. Sinuses/Orbits: Rounded opacity in the  floor the right maxillary sinus may be a mucous retention cyst or polyp. Visualized paranasal sinuses and mastoid air cells otherwise well aerated. Other: None. IMPRESSION: No acute intracranial abnormality. Electronically Signed   By: Acquanetta Belling M.D.   On: 04/24/2021 11:24   CT Angio Chest PE W and/or Wo Contrast  Result Date: 04/24/2021 CLINICAL DATA:  abdomen pains, esp after receiving morphine as he stated. Patient coming from home complaint of left rib pain for several days. Short of breath and chest pain. EXAM: CT ANGIOGRAPHY CHEST WITH CONTRAST TECHNIQUE: Multidetector CT imaging of the chest was performed using the standard protocol during bolus administration of intravenous contrast. Multiplanar CT image reconstructions and MIPs were obtained to evaluate the vascular anatomy. CONTRAST:  18mL OMNIPAQUE IOHEXOL 350 MG/ML SOLN COMPARISON:  Chest x-ray 04/23/2021. FINDINGS: Cardiovascular: Satisfactory opacification of the pulmonary arteries to the segmental level. No evidence of pulmonary embolism. Normal heart size. No pericardial effusion. Mediastinum/Nodes: Left hilar lymphadenopathy. No enlarged mediastinal, right hilar, or axillary lymph nodes. Thyroid gland, trachea, and esophagus demonstrate no significant findings. Elevated left hemidiaphragm. Lungs/Pleura: Poorly defined left lower lobe base ground-glass airspace opacity. Similar to lesser extent findings within the left lower  lobe. Right lower lobe subsegmental atelectasis. Severe paraseptal and centrilobular emphysematous changes. No pleural effusion. No pneumothorax. Upper Abdomen: No acute abnormality. Musculoskeletal: Review of the MIP images confirms the above findings. IMPRESSION: 1. No central or segmental pulmonary embolus. Limited evaluation of the subsegmental level due to motion artifact. 2. Poorly defined left lower lobe base ground-glass airspace opacity suggestive of infection/inflammation. COVID-19 infection not excluded. 3. Likely reactive left hilar lymphadenopathy. Recommend attention on short-term follow-up. 4. Aortic Atherosclerosis (ICD10-I70.0) and Emphysema (ICD10-J43.9). Electronically Signed   By: Tish Frederickson M.D.   On: 04/24/2021 00:14    Procedures Procedures   Medications Ordered in ED Medications  enoxaparin (LOVENOX) injection 40 mg (40 mg Subcutaneous Given 04/24/21 0917)  cefTRIAXone (ROCEPHIN) 2 g in sodium chloride 0.9 % 100 mL IVPB (has no administration in time range)  guaiFENesin (MUCINEX) 12 hr tablet 600 mg (600 mg Oral Given 04/24/21 0917)  HYDROcodone-acetaminophen (NORCO/VICODIN) 5-325 MG per tablet 1 tablet (1 tablet Oral Given 04/24/21 1515)  predniSONE (DELTASONE) tablet 40 mg (40 mg Oral Given 04/24/21 0950)  SUMAtriptan (IMITREX) tablet 50 mg (has no administration in time range)  budesonide (PULMICORT) nebulizer solution 0.5 mg (0.5 mg Nebulization Not Given 04/24/21 1117)  arformoterol (BROVANA) nebulizer solution 15 mcg (15 mcg Nebulization Not Given 04/24/21 1117)  ipratropium-albuterol (DUONEB) 0.5-2.5 (3) MG/3ML nebulizer solution 3 mL (3 mLs Nebulization Given 04/24/21 1515)  azithromycin (ZITHROMAX) 500 mg in sodium chloride 0.9 % 250 mL IVPB (has no administration in time range)  morphine 2 MG/ML injection 2 mg (2 mg Intravenous Given 04/24/21 1300)  albuterol (PROVENTIL) (2.5 MG/3ML) 0.083% nebulizer solution 2.5 mg (has no administration in time range)  morphine 4  MG/ML injection 6 mg (6 mg Intravenous Given 04/23/21 2206)  ipratropium-albuterol (DUONEB) 0.5-2.5 (3) MG/3ML nebulizer solution 3 mL (3 mLs Nebulization Given 04/23/21 2242)  ondansetron (ZOFRAN) injection 4 mg (4 mg Intravenous Given 04/24/21 0115)  iohexol (OMNIPAQUE) 350 MG/ML injection 50 mL (50 mLs Intravenous Contrast Given 04/23/21 2354)  cefTRIAXone (ROCEPHIN) 2 g in sodium chloride 0.9 % 100 mL IVPB (0 g Intravenous Stopped 04/24/21 0154)  azithromycin (ZITHROMAX) 500 mg in sodium chloride 0.9 % 250 mL IVPB (0 mg Intravenous Stopped 04/24/21 0316)  iohexol (OMNIPAQUE) 9 MG/ML oral solution (500 mLs  Contrast  Given 04/24/21 0121)  HYDROmorphone (DILAUDID) injection 1 mg (1 mg Intravenous Given 04/24/21 0155)  lactated ringers bolus 1,000 mL (0 mLs Intravenous Stopped 04/24/21 0316)  0.9 %  sodium chloride infusion ( Intravenous New Bag/Given 04/24/21 1302)    ED Course  I have reviewed the triage vital signs and the nursing notes.  Pertinent labs & imaging results that were available during my care of the patient were reviewed by me and considered in my medical decision making (see chart for details).    MDM Rules/Calculators/A&P                          Patient presents with severe pain underneath his ribs in the front of his chest.  It is pleuritic.  He does appear to be in severe pain although his vital signs are stable.  He has poor pulse ox waveform and is wearing 2 L nasal cannula.  When I discontinued his 2 L he had a very poor waveform but did not rate above 90 so his oxygen was continued.  He does not have clinical signs or symptoms of DVT but has a family history of blood clots.  Considerations include ACS, PE pulses, no hypertension, his pain does not radiate to his back, no neurologic deficit.  Also considered mesenteric ischemia or bowel perforation given his severe pain although his abdomen is nontender and he complains of chest pain and his pain is pleuritic and worse with  breathing so an intra-abdominal etiology does seem less likely.    Reviewed his EKG; sinus rhythm, not tachycardic, no focal ischemic changes, no conduction delays.  Read his chest x-ray; no focal consolidations, no signs of wedge infarct, no cardiomegaly.  Laboratory studies ordered.  Given that he was wheezing, ordered duo nebs with his history of COPD, although his aeration is overall good, his wheezing is expiratory phase, and it does not explain his pain.  Laboratory studies overall reassuring. CTA pending read at time of handoff; no emergent finding on my review. Care handed off to Dr. Preston Fleeting at 0000.  Final Clinical Impression(s) / ED Diagnoses Final diagnoses:  Pleuritic chest pain  Community acquired pneumonia of left lower lobe of lung    Rx / DC Orders ED Discharge Orders    None       Jacklynn Bue, MD 04/24/21 1519    Pricilla Loveless, MD 04/26/21 [phone]

## 2021-04-23 NOTE — ED Triage Notes (Signed)
Patient coming from home complaint of left rib pain for several days. States he works at [employer] and may have hurt it. VSS. NAD.

## 2021-04-23 NOTE — ED Provider Notes (Signed)
Emergency Medicine Provider Triage Evaluation Note  [name] , a 55 y.o. male  was evaluated in triage.  Pt complains of left sided rib pain x2 days. 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 Pain worse with deep inspiration. Denies history of blood clots, recent surgeries, recent immobilizations, lower extremity edema, and hormonal treatments. No central chest pain. No overlying rash.   Review of Systems  Positive: Rib pain Negative: fever  Physical Exam  BP 135/85 (BP Location: Left Arm)   Pulse 97   Temp 99.1 F (37.3 C)   Resp (!) 24   SpO2 97%  Gen:   Awake, no distress  Resp:  Normal effort  MSK:   Moves extremities without difficulty  Other:  No reproducible tenderness throughout left ribs.  Medical Decision Making  Medically screening exam initiated at 5:09 PM.  Appropriate orders placed.  [name] was informed that the remainder of the evaluation will be completed by another provider, this initial triage assessment does not replace that evaluation, and the importance of remaining in the ED until their evaluation is complete.  Rib pain after possible injury at work. X-ray ordered. No tachycardia or hypoxia at triage.    [name] 04/23/21 1711    Peter Lefevre, MD 04/23/21 [phone]

## 2021-04-24 ENCOUNTER — Encounter (HOSPITAL_COMMUNITY): Payer: Self-pay | Admitting: Internal Medicine

## 2021-04-24 ENCOUNTER — Other Ambulatory Visit: Payer: Self-pay | Admitting: Pulmonary Disease

## 2021-04-24 ENCOUNTER — Inpatient Hospital Stay (HOSPITAL_COMMUNITY): Payer: BC Managed Care – PPO

## 2021-04-24 ENCOUNTER — Emergency Department (HOSPITAL_COMMUNITY): Payer: BC Managed Care – PPO

## 2021-04-24 DIAGNOSIS — J449 Chronic obstructive pulmonary disease, unspecified: Secondary | ICD-10-CM | POA: Diagnosis not present

## 2021-04-24 DIAGNOSIS — T1490XA Injury, unspecified, initial encounter: Secondary | ICD-10-CM | POA: Diagnosis present

## 2021-04-24 DIAGNOSIS — F17210 Nicotine dependence, cigarettes, uncomplicated: Secondary | ICD-10-CM | POA: Diagnosis present

## 2021-04-24 DIAGNOSIS — R0781 Pleurodynia: Secondary | ICD-10-CM | POA: Diagnosis not present

## 2021-04-24 DIAGNOSIS — J9601 Acute respiratory failure with hypoxia: Secondary | ICD-10-CM | POA: Diagnosis not present

## 2021-04-24 DIAGNOSIS — D72829 Elevated white blood cell count, unspecified: Secondary | ICD-10-CM | POA: Diagnosis not present

## 2021-04-24 DIAGNOSIS — Z825 Family history of asthma and other chronic lower respiratory diseases: Secondary | ICD-10-CM | POA: Diagnosis not present

## 2021-04-24 DIAGNOSIS — J189 Pneumonia, unspecified organism: Secondary | ICD-10-CM | POA: Diagnosis not present

## 2021-04-24 DIAGNOSIS — Z831 Family history of other infectious and parasitic diseases: Secondary | ICD-10-CM | POA: Diagnosis not present

## 2021-04-24 DIAGNOSIS — R0902 Hypoxemia: Secondary | ICD-10-CM | POA: Diagnosis not present

## 2021-04-24 DIAGNOSIS — J440 Chronic obstructive pulmonary disease with acute lower respiratory infection: Secondary | ICD-10-CM | POA: Diagnosis present

## 2021-04-24 DIAGNOSIS — Z8616 Personal history of COVID-19: Secondary | ICD-10-CM | POA: Diagnosis not present

## 2021-04-24 DIAGNOSIS — H547 Unspecified visual loss: Secondary | ICD-10-CM | POA: Diagnosis present

## 2021-04-24 DIAGNOSIS — Z8249 Family history of ischemic heart disease and other diseases of the circulatory system: Secondary | ICD-10-CM | POA: Diagnosis not present

## 2021-04-24 DIAGNOSIS — G43909 Migraine, unspecified, not intractable, without status migrainosus: Secondary | ICD-10-CM | POA: Diagnosis present

## 2021-04-24 DIAGNOSIS — R17 Unspecified jaundice: Secondary | ICD-10-CM | POA: Diagnosis present

## 2021-04-24 DIAGNOSIS — R7989 Other specified abnormal findings of blood chemistry: Secondary | ICD-10-CM | POA: Diagnosis not present

## 2021-04-24 DIAGNOSIS — R031 Nonspecific low blood-pressure reading: Secondary | ICD-10-CM | POA: Diagnosis present

## 2021-04-24 DIAGNOSIS — Z9049 Acquired absence of other specified parts of digestive tract: Secondary | ICD-10-CM | POA: Diagnosis not present

## 2021-04-24 DIAGNOSIS — Z809 Family history of malignant neoplasm, unspecified: Secondary | ICD-10-CM | POA: Diagnosis not present

## 2021-04-24 DIAGNOSIS — Z833 Family history of diabetes mellitus: Secondary | ICD-10-CM | POA: Diagnosis not present

## 2021-04-24 DIAGNOSIS — G43911 Migraine, unspecified, intractable, with status migrainosus: Secondary | ICD-10-CM

## 2021-04-24 DIAGNOSIS — Z20822 Contact with and (suspected) exposure to covid-19: Secondary | ICD-10-CM | POA: Diagnosis present

## 2021-04-24 DIAGNOSIS — F1010 Alcohol abuse, uncomplicated: Secondary | ICD-10-CM | POA: Diagnosis present

## 2021-04-24 DIAGNOSIS — Z79899 Other long term (current) drug therapy: Secondary | ICD-10-CM | POA: Diagnosis not present

## 2021-04-24 DIAGNOSIS — J441 Chronic obstructive pulmonary disease with (acute) exacerbation: Secondary | ICD-10-CM | POA: Diagnosis not present

## 2021-04-24 LAB — RESP PANEL BY RT-PCR (FLU A&B, COVID) ARPGX2
Influenza A by PCR: NEGATIVE
Influenza B by PCR: NEGATIVE
SARS Coronavirus 2 by RT PCR: NEGATIVE

## 2021-04-24 LAB — URINALYSIS, ROUTINE W REFLEX MICROSCOPIC
Bilirubin Urine: NEGATIVE
Glucose, UA: NEGATIVE mg/dL
Hgb urine dipstick: NEGATIVE
Ketones, ur: 5 mg/dL — AB
Leukocytes,Ua: NEGATIVE
Nitrite: NEGATIVE
Protein, ur: NEGATIVE mg/dL
Specific Gravity, Urine: 1.032 — ABNORMAL HIGH (ref 1.005–1.030)
pH: 5 (ref 5.0–8.0)

## 2021-04-24 LAB — TROPONIN I (HIGH SENSITIVITY): Troponin I (High Sensitivity): 3 ng/L (ref <18)

## 2021-04-24 LAB — HIV ANTIBODY (ROUTINE TESTING W REFLEX): HIV Screen 4th Generation wRfx: NONREACTIVE

## 2021-04-24 LAB — LACTIC ACID, PLASMA: Lactic Acid, Venous: 0.7 mmol/L (ref 0.5–1.9)

## 2021-04-24 LAB — PROCALCITONIN: Procalcitonin: <0.1 ng/mL

## 2021-04-24 MED ORDER — BUDESONIDE 0.5 MG/2ML IN SUSP
0.5000 mg | Freq: Two times a day (BID) | RESPIRATORY_TRACT | Status: DC
  Administered 2021-04-24 – 2021-04-26 (×4): 0.5 mg via RESPIRATORY_TRACT
  Filled 2021-04-24 (×6): qty 2

## 2021-04-24 MED ORDER — SODIUM CHLORIDE 0.9 % IV SOLN
500.0000 mg | Freq: Once | INTRAVENOUS | Status: AC
  Administered 2021-04-24: 500 mg via INTRAVENOUS
  Filled 2021-04-24: qty 500

## 2021-04-24 MED ORDER — LACTATED RINGERS IV BOLUS
1000.0000 mL | Freq: Once | INTRAVENOUS | Status: AC
  Administered 2021-04-24: 1000 mL via INTRAVENOUS

## 2021-04-24 MED ORDER — HYDROCODONE-ACETAMINOPHEN 5-325 MG PO TABS
1.0000 | ORAL_TABLET | Freq: Four times a day (QID) | ORAL | Status: DC | PRN
  Administered 2021-04-24 – 2021-04-25 (×4): 1 via ORAL
  Filled 2021-04-24 (×4): qty 1

## 2021-04-24 MED ORDER — IPRATROPIUM-ALBUTEROL 0.5-2.5 (3) MG/3ML IN SOLN
3.0000 mL | Freq: Four times a day (QID) | RESPIRATORY_TRACT | Status: DC
  Administered 2021-04-24 – 2021-04-25 (×5): 3 mL via RESPIRATORY_TRACT
  Filled 2021-04-24 (×5): qty 3

## 2021-04-24 MED ORDER — SUMATRIPTAN SUCCINATE 50 MG PO TABS
50.0000 mg | ORAL_TABLET | ORAL | Status: DC | PRN
  Administered 2021-04-24 – 2021-04-25 (×2): 50 mg via ORAL
  Filled 2021-04-24 (×4): qty 1

## 2021-04-24 MED ORDER — IOHEXOL 9 MG/ML PO SOLN
ORAL | Status: AC
  Administered 2021-04-24: 500 mL
  Filled 2021-04-24: qty 1000

## 2021-04-24 MED ORDER — HYDROMORPHONE HCL 1 MG/ML IJ SOLN
1.0000 mg | Freq: Once | INTRAMUSCULAR | Status: AC
  Administered 2021-04-24: 1 mg via INTRAVENOUS
  Filled 2021-04-24: qty 1

## 2021-04-24 MED ORDER — SODIUM CHLORIDE 0.9 % IV SOLN
2.0000 g | Freq: Once | INTRAVENOUS | Status: AC
  Administered 2021-04-24: 2 g via INTRAVENOUS
  Filled 2021-04-24: qty 20

## 2021-04-24 MED ORDER — MORPHINE SULFATE (PF) 2 MG/ML IV SOLN
2.0000 mg | INTRAVENOUS | Status: AC | PRN
  Administered 2021-04-24 (×3): 2 mg via INTRAVENOUS
  Filled 2021-04-24 (×3): qty 1

## 2021-04-24 MED ORDER — PREDNISONE 20 MG PO TABS
40.0000 mg | ORAL_TABLET | Freq: Every day | ORAL | Status: DC
  Administered 2021-04-24 – 2021-04-26 (×3): 40 mg via ORAL
  Filled 2021-04-24 (×4): qty 2

## 2021-04-24 MED ORDER — SODIUM CHLORIDE 0.9 % IV SOLN
2.0000 g | INTRAVENOUS | Status: DC
  Administered 2021-04-25: 2 g via INTRAVENOUS
  Filled 2021-04-24 (×2): qty 20

## 2021-04-24 MED ORDER — ARFORMOTEROL TARTRATE 15 MCG/2ML IN NEBU
15.0000 ug | INHALATION_SOLUTION | Freq: Two times a day (BID) | RESPIRATORY_TRACT | Status: DC
  Administered 2021-04-24 – 2021-04-26 (×4): 15 ug via RESPIRATORY_TRACT
  Filled 2021-04-24 (×6): qty 2

## 2021-04-24 MED ORDER — SODIUM CHLORIDE 0.9 % IV SOLN: Freq: Once | INTRAVENOUS | Status: AC

## 2021-04-24 MED ORDER — GUAIFENESIN ER 600 MG PO TB12
600.0000 mg | ORAL_TABLET | Freq: Two times a day (BID) | ORAL | Status: DC
  Administered 2021-04-24 – 2021-04-26 (×5): 600 mg via ORAL
  Filled 2021-04-24 (×5): qty 1

## 2021-04-24 MED ORDER — SODIUM CHLORIDE 0.9 % IV SOLN
500.0000 mg | INTRAVENOUS | Status: DC
  Administered 2021-04-25: 500 mg via INTRAVENOUS
  Filled 2021-04-24: qty 500

## 2021-04-24 MED ORDER — ALBUTEROL SULFATE (2.5 MG/3ML) 0.083% IN NEBU: 2.5000 mg | INHALATION_SOLUTION | RESPIRATORY_TRACT | Status: DC | PRN

## 2021-04-24 MED ORDER — ENOXAPARIN SODIUM 40 MG/0.4ML IJ SOSY
40.0000 mg | PREFILLED_SYRINGE | INTRAMUSCULAR | Status: DC
  Administered 2021-04-24 – 2021-04-26 (×3): 40 mg via SUBCUTANEOUS
  Filled 2021-04-24 (×3): qty 0.4

## 2021-04-24 NOTE — H&P (Addendum)
History and Physical    [name] ZOX:[id number] DOB: November 02, 1966 DOA: 04/23/2021  Referring MD/NP/PA: Midge MiniumArshad Kakrakandy, MD PCP: Medicine, [facility]  Patient coming from: Home  Chief Complaint: Left-sided rib pain  I have personally briefly reviewed patient's old medical records in [facility]   HPI: [name] is a 55 y.o. male with medical history significant of COPD, migraine headaches, and history of COVID-19 back in 2021 who presents with complaints of left-sided rib pain for the last 2-3 days.  Symptoms started suddenly after waking up.  Describes it as a severe sharp pain causes him to have difficulty taking a deep inspiratory breath.  Pain feels like it is underneath his ribs and it is not tender to the touch.  Normally patient is on his feet all day with his job at [employer] and denies any recent injury or trauma to the onset symptoms.  Associated symptoms include subjective fever/chills, productive cough, wheezing, generalized malaise, decreased appetite, and persistent headache.  He reports that the migriane has been present for 2 months now it seems and has caused him to have some vision loss.  Denies any leg swelling, calf pain, palpitations, personal or family history of blood clots.  He does admit to smoking 1 to 2 cigarettes most days and drinks 1-2 beers most days during the week.   ED Course: Upon admission into the emergency department patient was seen to be afebrile with respirations 15-25, blood pressure currently maintained, and O2 saturations as low as 87% currently maintained on 4 L nasal cannula oxygen.  Lab significant for WBC 10.9, total bilirubin 1.8, lactic acid 1.9, and high-sensitivity troponin negative x2.  CT angiogram of the chest was negative for any signs of a pulmonary embolus, but did show left lower lobe opacity concerning for pneumonia.  CT scan of the abdomen pelvis was also performed but did not note any acute abnormalities.   While in the ED patient had been given pain medication of Dilaudid IV which dropped blood pressure as low as 75/60, but improved after bolus of normal saline IV fluids of 1 L.  Patient was given Rocephin and azithromycin.  TRH called to admit.  Review of Systems  Constitutional: Positive for chills, fever and malaise/fatigue.  HENT: Negative for congestion and ear discharge.   Eyes: Negative for pain.       Positive for change in vision  Respiratory: Positive for cough, sputum production, shortness of breath and wheezing.   Cardiovascular: Negative for chest pain and leg swelling.  Gastrointestinal: Negative for abdominal pain, nausea and vomiting.  Genitourinary: Negative for dysuria and hematuria.  Musculoskeletal: Negative for falls and joint pain.  Neurological: Positive for headaches. Negative for loss of consciousness.  Psychiatric/Behavioral: Positive for substance abuse. Negative for memory loss.    Past Medical History:  Diagnosis Date  . COPD (chronic obstructive pulmonary disease) (HCC)   . Migraine   . Migraine with status migrainosus 02/26/2013  . Pneumonia     Past Surgical History:  Procedure Laterality Date  . CHOLECYSTECTOMY  2013  . FINGER SURGERY    . ROTATOR CUFF REPAIR Right 2013  . STOMACH SURGERY       reports that he quit smoking about 15 months ago. His smoking use included cigarettes. He has a 21.00 pack-year smoking history. He has never used smokeless tobacco. He reports current drug use. Drug: Marijuana. He reports that he does not drink alcohol.  No Known Allergies  Family History  Problem Relation Age of Onset  . Hypertension Mother   . Tuberculosis Mother   . Diabetes Mother   . COPD Mother   . COPD Father   . Cancer Sister   . COPD Brother   . Heart attack Brother     Prior to Admission medications   Medication Sig Start Date End Date Taking? Authorizing Provider  acetaminophen (TYLENOL) 325 MG tablet Take 650 mg by mouth every 6 (six)  hours as needed for moderate pain.   Yes [provider]  albuterol (VENTOLIN HFA) 108 (90 Base) MCG/ACT inhaler TAKE 2 PUFFS BY MOUTH EVERY 6 HOURS AS NEEDED FOR WHEEZE OR SHORTNESS OF BREATH Patient taking differently: Inhale 2 puffs into the lungs every 6 (six) hours as needed for wheezing or shortness of breath. 03/31/21  Yes Luciano Cutter, MD  Budeson-Glycopyrrol-Formoterol (BREZTRI AEROSPHERE) 160-9-4.8 MCG/ACT AERO Inhale 2 puffs into the lungs 2 (two) times daily. 03/28/20  Yes Luciano Cutter, MD  ipratropium-albuterol (DUONEB) 0.5-2.5 (3) MG/3ML SOLN TAKE 3 MLS BY NEBULIZATION EVERY 6 (SIX) HOURS AS NEEDED. Patient taking differently: Take 3 mLs by nebulization every 6 (six) hours as needed (shortness of breath / wheezing). 02/26/20  Yes Glenford Bayley, NP  cetirizine (ZYRTEC ALLERGY) 10 MG tablet Take 1 tablet (10 mg total) by mouth daily. Patient not taking: Reported on 04/24/2021 03/03/21   Wallis Bamberg, PA-C  diclofenac (VOLTAREN) 50 MG EC tablet Take 1 tablet (50 mg total) by mouth 2 (two) times daily. Patient not taking: Reported on 04/24/2021 07/18/20   Belinda Fisher, PA-C  diclofenac Sodium (VOLTAREN) 1 % GEL Apply 2 g topically 4 (four) times daily. Patient not taking: Reported on 04/24/2021 08/02/20   Wieters, Hallie C, PA-C  fluticasone (FLONASE) 50 MCG/ACT nasal spray Place 2 sprays into both nostrils daily. Patient not taking: Reported on 04/24/2021 03/03/21   Wallis Bamberg, PA-C  hydrOXYzine (ATARAX/VISTARIL) 25 MG tablet Take 1 tablet (25 mg total) by mouth at bedtime as needed (for insomnia). Patient not taking: Reported on 04/24/2021 03/03/21   Wallis Bamberg, PA-C  naproxen (NAPROSYN) 500 MG tablet Take 1 tablet (500 mg total) by mouth 2 (two) times daily with a meal. Patient not taking: Reported on 04/24/2021 03/03/21   Wallis Bamberg, PA-C  traMADol (ULTRAM) 50 MG tablet Take 1 tablet (50 mg total) by mouth every 12 (twelve) hours as needed. Patient not taking: Reported on  04/24/2021 07/18/20   Lurline Idol    Physical Exam:  Constitutional: middle age male who appears to be in acute discomfort especially with coughing. Vitals:   04/24/21 0500 04/24/21 0600 04/24/21 0715 04/24/21 0730  BP: 122/81 118/72 117/76 118/72  Pulse: 64 65 73 66  Resp:   20 (!) 22  Temp:    99 F (37.2 C)  TempSrc:    Oral  SpO2: 92% 93% 93% 91%   Eyes: PERRL, lids and conjunctivae normal ENMT: Mucous membranes are dry. Posterior pharynx clear of any exudate or lesions. Neck: normal, supple, no masses, no thyromegaly Respiratory: Decreased overall aeration with expiratory wheeze appreciated.  Patient only able to talk in short sentences Cardiovascular: Regular rate and rhythm, no murmurs / rubs / gallops. No extremity edema. 2+ pedal pulses. No carotid bruits.  No tenderness to palpation. Abdomen: no tenderness, no masses palpated. No hepatosplenomegaly. Bowel sounds positive.  Musculoskeletal: no clubbing / cyanosis. No joint deformity upper and lower extremities. Good ROM, no contractures. Normal muscle tone.  Skin: no  rashes, lesions, ulcers. No induration Neurologic: CN 2-12 grossly intact. Sensation intact, DTR normal. Strength 5/5 in all 4.  Psychiatric: Normal judgment and insight. Alert and oriented x 3. Normal mood.     Labs on Admission: I have personally reviewed following labs and imaging studies  CBC: Recent Labs  Lab 04/23/21 2153  WBC 10.9*  NEUTROABS 8.6*  HGB 15.4  HCT 44.7  MCV 94.1  PLT 281   Basic Metabolic Panel: Recent Labs  Lab 04/23/21 2153  NA 136  K 4.8  CL 100  CO2 26  GLUCOSE 122*  BUN 12  CREATININE 1.16  CALCIUM 9.3   GFR: CrCl cannot be calculated (Unknown ideal weight.). Liver Function Tests: Recent Labs  Lab 04/23/21 2153  AST 28  ALT 27  ALKPHOS 73  BILITOT 1.8*  PROT 7.9  ALBUMIN 3.6   Recent Labs  Lab 04/23/21 2153  LIPASE 28   No results for input(s): AMMONIA in the last 168 hours. Coagulation  Profile: No results for input(s): INR, PROTIME in the last 168 hours. Cardiac Enzymes: No results for input(s): CKTOTAL, CKMB, CKMBINDEX, TROPONINI in the last 168 hours. BNP (last 3 results) No results for input(s): PROBNP in the last 8760 hours. HbA1C: No results for input(s): HGBA1C in the last 72 hours. CBG: No results for input(s): GLUCAP in the last 168 hours. Lipid Profile: No results for input(s): CHOL, HDL, LDLCALC, TRIG, CHOLHDL, LDLDIRECT in the last 72 hours. Thyroid Function Tests: No results for input(s): TSH, T4TOTAL, FREET4, T3FREE, THYROIDAB in the last 72 hours. Anemia Panel: No results for input(s): VITAMINB12, FOLATE, FERRITIN, TIBC, IRON, RETICCTPCT in the last 72 hours. Urine analysis:    Component Value Date/Time   COLORURINE YELLOW 02/16/2009 2042   APPEARANCEUR CLEAR 02/16/2009 2042   LABSPEC 1.019 02/16/2009 2042   PHURINE 5.5 02/16/2009 2042   GLUCOSEU NEGATIVE 02/16/2009 2042   HGBUR NEGATIVE 02/16/2009 2042   BILIRUBINUR NEGATIVE 02/16/2009 2042   KETONESUR NEGATIVE 02/16/2009 2042   PROTEINUR NEGATIVE 02/16/2009 2042   UROBILINOGEN 1.0 02/16/2009 2042   NITRITE NEGATIVE 02/16/2009 2042   LEUKOCYTESUR  02/16/2009 2042    NEGATIVE MICROSCOPIC NOT DONE ON URINES WITH NEGATIVE PROTEIN, BLOOD, LEUKOCYTES, NITRITE, OR GLUCOSE <1000 mg/dL.   Sepsis Labs: Recent Results (from the past 240 hour(s))  Resp Panel by RT-PCR (Flu A&B, Covid) Nasopharyngeal Swab     Status: None   Collection Time: 04/24/21  1:10 AM   Specimen: Nasopharyngeal Swab; Nasopharyngeal(NP) swabs in vial transport medium  Result Value Ref Range Status   SARS Coronavirus 2 by RT PCR NEGATIVE NEGATIVE Final    Comment: (NOTE) SARS-CoV-2 target nucleic acids are NOT DETECTED.  The SARS-CoV-2 RNA is generally detectable in upper respiratory specimens during the acute phase of infection. The lowest concentration of SARS-CoV-2 viral copies this assay can detect is 138 copies/mL. A  negative result does not preclude SARS-Cov-2 infection and should not be used as the sole basis for treatment or other patient management decisions. A negative result may occur with  improper specimen collection/handling, submission of specimen other than nasopharyngeal swab, presence of viral mutation(s) within the areas targeted by this assay, and inadequate number of viral copies(<138 copies/mL). A negative result must be combined with clinical observations, patient history, and epidemiological information. The expected result is Negative.  Fact Sheet for Patients:  BloggerCourse.com  Fact Sheet for Healthcare Providers:  SeriousBroker.it  This test is no t yet approved or cleared by the [place] and  has been authorized for detection and/or diagnosis of SARS-CoV-2 by FDA under an Emergency Use Authorization (EUA). This EUA will remain  in effect (meaning this test can be used) for the duration of the COVID-19 declaration under Section 564(b)(1) of the Act, 21 U.S.C.section 360bbb-3(b)(1), unless the authorization is terminated  or revoked sooner.       Influenza A by PCR NEGATIVE NEGATIVE Final   Influenza B by PCR NEGATIVE NEGATIVE Final    Comment: (NOTE) The Xpert Xpress SARS-CoV-2/FLU/RSV plus assay is intended as an aid in the diagnosis of influenza from Nasopharyngeal swab specimens and should not be used as a sole basis for treatment. Nasal washings and aspirates are unacceptable for Xpert Xpress SARS-CoV-2/FLU/RSV testing.  Fact Sheet for Patients: BloggerCourse.com  Fact Sheet for Healthcare Providers: SeriousBroker.it  This test is not yet approved or cleared by the [place] FDA and has been authorized for detection and/or diagnosis of SARS-CoV-2 by FDA under an Emergency Use Authorization (EUA). This EUA will remain in effect (meaning this test can  be used) for the duration of the COVID-19 declaration under Section 564(b)(1) of the Act, 21 U.S.C. section 360bbb-3(b)(1), unless the authorization is terminated or revoked.  Performed at [facility], [address] [place], [place] [address]      Radiological Exams on Admission: CT ABDOMEN PELVIS WO CONTRAST  Result Date: 04/24/2021 CLINICAL DATA:  Diverticulitis suspected. Left anterior T7 area pain. EXAM: CT ABDOMEN AND PELVIS WITHOUT CONTRAST TECHNIQUE: Multidetector CT imaging of the abdomen and pelvis was performed following the standard protocol without IV contrast. COMPARISON:  CT angiography chest 04/23/2021 FINDINGS: Lower chest: Interval development of trace bilateral pleural effusions. Bilateral lower lobe atelectasis. Emphysema. Hepatobiliary: No focal liver abnormality is seen. Status post cholecystectomy. No biliary dilatation. Pancreas: No focal lesion. Normal pancreatic contour. No surrounding inflammatory changes. No main pancreatic ductal dilatation. Spleen: Normal in size without focal abnormality. Adrenals/Urinary Tract: No adrenal nodule bilaterally. Excretion of previously administered intravenous contrast noted bilaterally. 2.1 cm fluid density lesion within left kidney likely represents a simple renal cyst. No nephrolithiasis, no hydronephrosis, and no contour-deforming renal mass. No ureterolithiasis or hydroureter. The urinary bladder is unremarkable. Stomach/Bowel: PO contrast reaches the mid small bowel. Stomach is within normal limits. No evidence of bowel wall thickening or dilatation. Appendix appears normal. Vascular/Lymphatic: No abdominal aorta or iliac aneurysm. Mild atherosclerotic plaque of the aorta and its branches. No abdominal, pelvic, or inguinal lymphadenopathy. Reproductive: Prostate is unremarkable. Other: No intraperitoneal free fluid. No intraperitoneal free gas. No organized fluid collection. Musculoskeletal: No acute or significant osseous  findings. IMPRESSION: 1. No acute intra-abdominal or intrapelvic abnormality with limited evaluation due to noncontrast study and motion artifact. 2. Aortic Atherosclerosis (ICD10-I70.0) and Emphysema (ICD10-J43.9). Electronically Signed   By: Tish Frederickson M.D.   On: 04/24/2021 03:57   DG Ribs Unilateral W/Chest Left  Result Date: 04/23/2021 CLINICAL DATA:  Left rib pain for several days. EXAM: LEFT RIBS AND CHEST - 3+ VIEW COMPARISON:  Chest and right rib radiographs 07/15/2020 FINDINGS: No fracture or other bone lesions are seen involving the ribs. A BB was placed at site of pain about lower ribs, no rib abnormality subjacent to the BB. There is no evidence of pneumothorax or pleural effusion. Streaky atelectasis involving the right greater than left lung base. No confluent consolidation. Heart size and mediastinal contours are within normal limits. IMPRESSION: 1. Unremarkable radiographic appearance of left ribs. 2. Streaky bibasilar atelectasis. Electronically Signed   By:  Narda Rutherford M.D.   On: 04/23/2021 18:06   CT Angio Chest PE W and/or Wo Contrast  Result Date: 04/24/2021 CLINICAL DATA:  abdomen pains, esp after receiving morphine as he stated. Patient coming from home complaint of left rib pain for several days. Short of breath and chest pain. EXAM: CT ANGIOGRAPHY CHEST WITH CONTRAST TECHNIQUE: Multidetector CT imaging of the chest was performed using the standard protocol during bolus administration of intravenous contrast. Multiplanar CT image reconstructions and MIPs were obtained to evaluate the vascular anatomy. CONTRAST:  79mL OMNIPAQUE IOHEXOL 350 MG/ML SOLN COMPARISON:  Chest x-ray 04/23/2021. FINDINGS: Cardiovascular: Satisfactory opacification of the pulmonary arteries to the segmental level. No evidence of pulmonary embolism. Normal heart size. No pericardial effusion. Mediastinum/Nodes: Left hilar lymphadenopathy. No enlarged mediastinal, right hilar, or axillary lymph nodes.  Thyroid gland, trachea, and esophagus demonstrate no significant findings. Elevated left hemidiaphragm. Lungs/Pleura: Poorly defined left lower lobe base ground-glass airspace opacity. Similar to lesser extent findings within the left lower lobe. Right lower lobe subsegmental atelectasis. Severe paraseptal and centrilobular emphysematous changes. No pleural effusion. No pneumothorax. Upper Abdomen: No acute abnormality. Musculoskeletal: Review of the MIP images confirms the above findings. IMPRESSION: 1. No central or segmental pulmonary embolus. Limited evaluation of the subsegmental level due to motion artifact. 2. Poorly defined left lower lobe base ground-glass airspace opacity suggestive of infection/inflammation. COVID-19 infection not excluded. 3. Likely reactive left hilar lymphadenopathy. Recommend attention on short-term follow-up. 4. Aortic Atherosclerosis (ICD10-I70.0) and Emphysema (ICD10-J43.9). Electronically Signed   By: Tish Frederickson M.D.   On: 04/24/2021 00:14    EKG: Independently reviewed.  Sinus rhythm at 94 bpm with LVH  Assessment/Plan Hypoxia secondary to community acquired pneumonia: Acute.  Patient presents with complaints of left-sided rib pain over the last 3 days.  CT scan of the chest with contrast negative for pulmonary embolus, but did note a left-sided pneumonia.  COVID-19 and influenza screening were negative.  O2 saturations were noted to be as low as 85% on room air intermittently, but suspect this is due to patient shallow breathing due to pain. -Admit to medical telemetry -Continuous pulse oximetry with O2 saturations to maintain O2 saturation -Incentive spirometry and flutter valve -Continue empiric antibiotics of Rocephin and azithromycin -Hydrocodone as needed for pain -Mucinex   COPD with chronic bronchitis: Patient noted to have expiratory wheezes on physical exam.  He reports smoking 1 to 2 cigarettes/day on average and declined nicotine patch. -COPD order  set -DuoNebs -Substituted inhaler for Brovana and budesonide nebs  Leukocytosis: Acute.  WBC elevated at 10.9.  Suspect secondary to above.  Patient had not recently been steroids. -Recheck CBC  Low blood pressure: Blood pressures dropped down as low as 75/60  after patient had received IV Dilaudid.  Blood pressures improved after 1 L IV fluid bolus. -Normal saline IV 75 mL/h  Migraine headache and visual changes: Patient reports having discussed with visual changes. -CT scan of the brain -Sumatriptan as needed  Hyperbilirubinemia: Acute.  Total bilirubin elevated at 1.8.  CT scan of the abdomen pelvis did not note any acute abnormalities.  Lipase and liver enzymes otherwise within normal limits. -Continue to monitor  DVT prophylaxis: Lovenox Code Status: Full Family Communication: Wife updated over the phone  Disposition Plan: Hopefully discharge home in 2 to 3-day Consults called: None Admission status: Inpatient, 2 midnight stay for treatment of pneumonia  Clydie Braun MD [facility]   If 7PM-7AM, please contact night-coverage   04/24/2021, 8:39 AM

## 2021-04-24 NOTE — ED Notes (Signed)
Patient transported to Ultrasound 

## 2021-04-24 NOTE — ED Provider Notes (Signed)
Care assumed from Drs. Goldston and Stricklin, patient with left costal pain and hypoxia.  CT angiogram of the chest has been obtained with results pending.  May need CT of abdomen if answers not readily apparent on CT of chest.  CT angiogram shows no evidence of pulmonary embolism, possible pneumonia noted left lower lobe.  This does correlate with where his symptoms are.  He will be sent for CT of abdomen and pelvis, will start on empiric antibiotics for community-acquired pneumonia.  CT of abdomen and pelvis shows no acute process.  Patient was requesting additional medication for pain relief.  He is given a dose of hydromorphone.  Following that, his blood pressure dropped down to 74 systolic.  He was given IV fluids and blood pressure has come back up.  He will need to be admitted to the hospital for pain control while getting antibiotics.  Case is discussed with Dr. Toniann Fail of [facility], who agrees to admit the patient.  Results for orders placed or performed during the hospital encounter of 04/23/21  Resp Panel by RT-PCR (Flu A&B, Covid) Nasopharyngeal Swab   Specimen: Nasopharyngeal Swab; Nasopharyngeal(NP) swabs in vial transport medium  Result Value Ref Range   SARS Coronavirus 2 by RT PCR NEGATIVE NEGATIVE   Influenza A by PCR NEGATIVE NEGATIVE   Influenza B by PCR NEGATIVE NEGATIVE  Lactic acid, plasma  Result Value Ref Range   Lactic Acid, Venous 1.9 0.5 - 1.9 mmol/L  Lactic acid, plasma  Result Value Ref Range   Lactic Acid, Venous 0.7 0.5 - 1.9 mmol/L  CBC with Differential  Result Value Ref Range   WBC 10.9 (H) 4.0 - 10.5 K/uL   RBC 4.75 4.22 - 5.81 MIL/uL   Hemoglobin 15.4 13.0 - 17.0 g/dL   HCT 22.2 97.9 - 89.2 %   MCV 94.1 80.0 - 100.0 fL   MCH 32.4 26.0 - 34.0 pg   MCHC 34.5 30.0 - 36.0 g/dL   RDW 11.9 41.7 - 40.8 %   Platelets 281 150 - 400 K/uL   nRBC 0.0 0.0 - 0.2 %   Neutrophils Relative % 80 %   Neutro Abs 8.6 (H) 1.7 - 7.7 K/uL   Lymphocytes  Relative 13 %   Lymphs Abs 1.4 0.7 - 4.0 K/uL   Monocytes Relative 7 %   Monocytes Absolute 0.8 0.1 - 1.0 K/uL   Eosinophils Relative 0 %   Eosinophils Absolute 0.0 0.0 - 0.5 K/uL   Basophils Relative 0 %   Basophils Absolute 0.0 0.0 - 0.1 K/uL   Immature Granulocytes 0 %   Abs Immature Granulocytes 0.04 0.00 - 0.07 K/uL  Comprehensive metabolic panel  Result Value Ref Range   Sodium 136 135 - 145 mmol/L   Potassium 4.8 3.5 - 5.1 mmol/L   Chloride 100 98 - 111 mmol/L   CO2 26 22 - 32 mmol/L   Glucose, Bld 122 (H) 70 - 99 mg/dL   BUN 12 6 - 20 mg/dL   Creatinine, Ser 1.44 0.61 - 1.24 mg/dL   Calcium 9.3 8.9 - 81.8 mg/dL   Total Protein 7.9 6.5 - 8.1 g/dL   Albumin 3.6 3.5 - 5.0 g/dL   AST 28 15 - 41 U/L   ALT 27 0 - 44 U/L   Alkaline Phosphatase 73 38 - 126 U/L   Total Bilirubin 1.8 (H) 0.3 - 1.2 mg/dL   GFR, Estimated >56 >31 mL/min   Anion gap 10 5 - 15  Lipase, blood  Result Value Ref Range   Lipase 28 11 - 51 U/L  Troponin I (High Sensitivity)  Result Value Ref Range   Troponin I (High Sensitivity) 5 <18 ng/L  Troponin I (High Sensitivity)  Result Value Ref Range   Troponin I (High Sensitivity) 3 <18 ng/L   CT ABDOMEN PELVIS WO CONTRAST  Result Date: 04/24/2021 CLINICAL DATA:  Diverticulitis suspected. Left anterior T7 area pain. EXAM: CT ABDOMEN AND PELVIS WITHOUT CONTRAST TECHNIQUE: Multidetector CT imaging of the abdomen and pelvis was performed following the standard protocol without IV contrast. COMPARISON:  CT angiography chest 04/23/2021 FINDINGS: Lower chest: Interval development of trace bilateral pleural effusions. Bilateral lower lobe atelectasis. Emphysema. Hepatobiliary: No focal liver abnormality is seen. Status post cholecystectomy. No biliary dilatation. Pancreas: No focal lesion. Normal pancreatic contour. No surrounding inflammatory changes. No main pancreatic ductal dilatation. Spleen: Normal in size without focal abnormality. Adrenals/Urinary Tract: No  adrenal nodule bilaterally. Excretion of previously administered intravenous contrast noted bilaterally. 2.1 cm fluid density lesion within left kidney likely represents a simple renal cyst. No nephrolithiasis, no hydronephrosis, and no contour-deforming renal mass. No ureterolithiasis or hydroureter. The urinary bladder is unremarkable. Stomach/Bowel: PO contrast reaches the mid small bowel. Stomach is within normal limits. No evidence of bowel wall thickening or dilatation. Appendix appears normal. Vascular/Lymphatic: No abdominal aorta or iliac aneurysm. Mild atherosclerotic plaque of the aorta and its branches. No abdominal, pelvic, or inguinal lymphadenopathy. Reproductive: Prostate is unremarkable. Other: No intraperitoneal free fluid. No intraperitoneal free gas. No organized fluid collection. Musculoskeletal: No acute or significant osseous findings. IMPRESSION: 1. No acute intra-abdominal or intrapelvic abnormality with limited evaluation due to noncontrast study and motion artifact. 2. Aortic Atherosclerosis (ICD10-I70.0) and Emphysema (ICD10-J43.9). Electronically Signed   By: Tish Frederickson M.D.   On: 04/24/2021 03:57   DG Ribs Unilateral W/Chest Left  Result Date: 04/23/2021 CLINICAL DATA:  Left rib pain for several days. EXAM: LEFT RIBS AND CHEST - 3+ VIEW COMPARISON:  Chest and right rib radiographs 07/15/2020 FINDINGS: No fracture or other bone lesions are seen involving the ribs. A BB was placed at site of pain about lower ribs, no rib abnormality subjacent to the BB. There is no evidence of pneumothorax or pleural effusion. Streaky atelectasis involving the right greater than left lung base. No confluent consolidation. Heart size and mediastinal contours are within normal limits. IMPRESSION: 1. Unremarkable radiographic appearance of left ribs. 2. Streaky bibasilar atelectasis. Electronically Signed   By: Narda Rutherford M.D.   On: 04/23/2021 18:06   CT Angio Chest PE W and/or Wo  Contrast  Result Date: 04/24/2021 CLINICAL DATA:  abdomen pains, esp after receiving morphine as he stated. Patient coming from home complaint of left rib pain for several days. Short of breath and chest pain. EXAM: CT ANGIOGRAPHY CHEST WITH CONTRAST TECHNIQUE: Multidetector CT imaging of the chest was performed using the standard protocol during bolus administration of intravenous contrast. Multiplanar CT image reconstructions and MIPs were obtained to evaluate the vascular anatomy. CONTRAST:  36mL OMNIPAQUE IOHEXOL 350 MG/ML SOLN COMPARISON:  Chest x-ray 04/23/2021. FINDINGS: Cardiovascular: Satisfactory opacification of the pulmonary arteries to the segmental level. No evidence of pulmonary embolism. Normal heart size. No pericardial effusion. Mediastinum/Nodes: Left hilar lymphadenopathy. No enlarged mediastinal, right hilar, or axillary lymph nodes. Thyroid gland, trachea, and esophagus demonstrate no significant findings. Elevated left hemidiaphragm. Lungs/Pleura: Poorly defined left lower lobe base ground-glass airspace opacity. Similar to lesser extent findings within the left lower lobe. Right lower lobe subsegmental  atelectasis. Severe paraseptal and centrilobular emphysematous changes. No pleural effusion. No pneumothorax. Upper Abdomen: No acute abnormality. Musculoskeletal: Review of the MIP images confirms the above findings. IMPRESSION: 1. No central or segmental pulmonary embolus. Limited evaluation of the subsegmental level due to motion artifact. 2. Poorly defined left lower lobe base ground-glass airspace opacity suggestive of infection/inflammation. COVID-19 infection not excluded. 3. Likely reactive left hilar lymphadenopathy. Recommend attention on short-term follow-up. 4. Aortic Atherosclerosis (ICD10-I70.0) and Emphysema (ICD10-J43.9). Electronically Signed   By: Tish Frederickson M.D.   On: 04/24/2021 00:14   Images viewed by me.    Dione Booze, MD 04/24/21 [phone]

## 2021-04-24 NOTE — Progress Notes (Signed)
NEW ADMISSION NOTE New Admission Note:   Arrival Method: Stretcher  Mental Orientation: A&O x4 Telemetry: 48M-03 Assessment: Completed Skin: Intact IV: See LDA Pain: 10/10 Tubes: none Safety Measures: Safety Fall Prevention Plan has been given, discussed and signed Admission: Completed [facility] Orientation: Patient has been orientated to the room, unit and staff.  Family: none present  Orders have been reviewed and implemented. Will continue to monitor the patient. Call light has been placed within reach and bed alarm has been activated.   Annia Belt, RN

## 2021-04-24 NOTE — ED Notes (Signed)
Breakfast provided.

## 2021-04-25 DIAGNOSIS — R0781 Pleurodynia: Secondary | ICD-10-CM

## 2021-04-25 LAB — COMPREHENSIVE METABOLIC PANEL
ALT: 101 U/L — ABNORMAL HIGH (ref 0–44)
AST: 34 U/L (ref 15–41)
Albumin: 3.1 g/dL — ABNORMAL LOW (ref 3.5–5.0)
Alkaline Phosphatase: 131 U/L — ABNORMAL HIGH (ref 38–126)
Anion gap: 6 (ref 5–15)
BUN: 13 mg/dL (ref 6–20)
CO2: 28 mmol/L (ref 22–32)
Calcium: 9.2 mg/dL (ref 8.9–10.3)
Chloride: 104 mmol/L (ref 98–111)
Creatinine, Ser: 0.99 mg/dL (ref 0.61–1.24)
GFR, Estimated: >60 mL/min (ref >60)
Glucose, Bld: 112 mg/dL — ABNORMAL HIGH (ref 70–99)
Potassium: 4.3 mmol/L (ref 3.5–5.1)
Sodium: 138 mmol/L (ref 135–145)
Total Bilirubin: 0.8 mg/dL (ref 0.3–1.2)
Total Protein: 7 g/dL (ref 6.5–8.1)

## 2021-04-25 LAB — CBC
HCT: 39.7 % (ref 39.0–52.0)
Hemoglobin: 13.6 g/dL (ref 13.0–17.0)
MCH: 31.8 pg (ref 26.0–34.0)
MCHC: 34.3 g/dL (ref 30.0–36.0)
MCV: 92.8 fL (ref 80.0–100.0)
Platelets: 287 10*3/uL (ref 150–400)
RBC: 4.28 MIL/uL (ref 4.22–5.81)
RDW: 13.5 % (ref 11.5–15.5)
WBC: 10.1 10*3/uL (ref 4.0–10.5)
nRBC: 0 % (ref 0.0–0.2)

## 2021-04-25 MED ORDER — IPRATROPIUM-ALBUTEROL 0.5-2.5 (3) MG/3ML IN SOLN
3.0000 mL | Freq: Three times a day (TID) | RESPIRATORY_TRACT | Status: DC
  Administered 2021-04-25 – 2021-04-26 (×2): 3 mL via RESPIRATORY_TRACT
  Filled 2021-04-25 (×2): qty 3

## 2021-04-25 MED ORDER — OXYCODONE HCL 5 MG PO TABS: 5.0000 mg | ORAL_TABLET | ORAL | Status: DC | PRN

## 2021-04-25 MED ORDER — KETOROLAC TROMETHAMINE 30 MG/ML IJ SOLN
30.0000 mg | Freq: Four times a day (QID) | INTRAMUSCULAR | Status: DC | PRN
  Administered 2021-04-25 (×2): 30 mg via INTRAVENOUS
  Filled 2021-04-25 (×2): qty 1

## 2021-04-25 MED ORDER — SODIUM CHLORIDE 0.9 % IV SOLN: INTRAVENOUS | Status: DC

## 2021-04-25 MED ORDER — SODIUM CHLORIDE 0.9 % IV SOLN: INTRAVENOUS | Status: AC

## 2021-04-25 MED ORDER — ACETAMINOPHEN 325 MG PO TABS
650.0000 mg | ORAL_TABLET | Freq: Four times a day (QID) | ORAL | Status: DC | PRN
  Administered 2021-04-25: 650 mg via ORAL
  Filled 2021-04-25: qty 2

## 2021-04-25 MED ORDER — ENSURE ENLIVE PO LIQD
237.0000 mL | ORAL | Status: DC
  Administered 2021-04-25: 237 mL via ORAL

## 2021-04-25 MED ORDER — AZITHROMYCIN 500 MG PO TABS
500.0000 mg | ORAL_TABLET | Freq: Every day | ORAL | Status: DC
  Administered 2021-04-26: 500 mg via ORAL
  Filled 2021-04-25: qty 1

## 2021-04-25 MED ORDER — ADULT MULTIVITAMIN W/MINERALS CH
1.0000 | ORAL_TABLET | Freq: Every day | ORAL | Status: DC
  Administered 2021-04-25 – 2021-04-26 (×2): 1 via ORAL
  Filled 2021-04-25 (×2): qty 1

## 2021-04-25 NOTE — Progress Notes (Signed)
[facility] PROGRESS NOTE    Progress Note  [name]  ZOX:[id number] DOB: 21-Sep-1966 DOA: 04/23/2021 PCP: Medicine, [facility]     Brief Narrative:   [name] is an 55 y.o. male past medical history significant for COPD, ongoing tobacco abuse and alcohol abuse also history of COVID-19 back in 2021 presents with complaint of left-sided rib pain that started through 3 days prior to admission in the ED was found to be hypoxic with a white count of 11 CT angio of the chest was negative for PE but did showed a left lower lobe opacity     Assessment/Plan:   Acute respiratory failure with hypoxia secondarily to community-acquired pneumonia: Started empirically on IV Rocephin and azithromycin, blood cultures have been sent, initially he was on 4 L of oxygen, now this morning he is on 2 L satting 96%. Will try to wean to room air blood cultures results are pending. Continues to complain of pain will not escalate narcotic treatment. We will go ahead and discontinue hydrocodone and start him on ketorolac and Tylenol.  COPD not oxygen dependent: Continues to use tobacco he was placed on a nicotine patch. Continue inhalers.  Transient hypotension: He did receive Dilaudid in the ED his blood pressure dropped to 75/60 asymptomatic he was given a liter bolus of normal saline and placed on an infusion. His blood pressure has stabilized. Will not use IV narcotics for his pain hydrocodone should be enough.  Migraine headaches and visual changes: CT scan of the brain showed no acute findings.  Hyperbilirubinemia: Infectious etiology.   DVT prophylaxis: lovenox Family Communication:none Status is: Inpatient  Remains inpatient appropriate because:Hemodynamically unstable   Dispo: The patient is from: Home              Anticipated d/c is to: Home              Patient currently is not medically stable to d/c.   Difficult to place patient  No        Code Status:     Code Status Orders  (From admission, onward)         Start     Ordered   04/24/21 0851  Full code  Continuous        04/24/21 0852        Code Status History    This patient has a current code status but no historical code status.   Advance Care Planning Activity        IV Access:    Peripheral IV   Procedures and diagnostic studies:   CT ABDOMEN PELVIS WO CONTRAST  Result Date: 04/24/2021 CLINICAL DATA:  Diverticulitis suspected. Left anterior T7 area pain. EXAM: CT ABDOMEN AND PELVIS WITHOUT CONTRAST TECHNIQUE: Multidetector CT imaging of the abdomen and pelvis was performed following the standard protocol without IV contrast. COMPARISON:  CT angiography chest 04/23/2021 FINDINGS: Lower chest: Interval development of trace bilateral pleural effusions. Bilateral lower lobe atelectasis. Emphysema. Hepatobiliary: No focal liver abnormality is seen. Status post cholecystectomy. No biliary dilatation. Pancreas: No focal lesion. Normal pancreatic contour. No surrounding inflammatory changes. No main pancreatic ductal dilatation. Spleen: Normal in size without focal abnormality. Adrenals/Urinary Tract: No adrenal nodule bilaterally. Excretion of previously administered intravenous contrast noted bilaterally. 2.1 cm fluid density lesion within left kidney likely represents a simple renal cyst. No nephrolithiasis, no hydronephrosis, and no contour-deforming renal mass. No ureterolithiasis or hydroureter. The urinary bladder is unremarkable. Stomach/Bowel: PO contrast reaches  the mid small bowel. Stomach is within normal limits. No evidence of bowel wall thickening or dilatation. Appendix appears normal. Vascular/Lymphatic: No abdominal aorta or iliac aneurysm. Mild atherosclerotic plaque of the aorta and its branches. No abdominal, pelvic, or inguinal lymphadenopathy. Reproductive: Prostate is unremarkable. Other: No intraperitoneal free fluid. No  intraperitoneal free gas. No organized fluid collection. Musculoskeletal: No acute or significant osseous findings. IMPRESSION: 1. No acute intra-abdominal or intrapelvic abnormality with limited evaluation due to noncontrast study and motion artifact. 2. Aortic Atherosclerosis (ICD10-I70.0) and Emphysema (ICD10-J43.9). Electronically Signed   By: Tish FredericksonMorgane  Naveau M.D.   On: 04/24/2021 03:57   DG Ribs Unilateral W/Chest Left  Result Date: 04/23/2021 CLINICAL DATA:  Left rib pain for several days. EXAM: LEFT RIBS AND CHEST - 3+ VIEW COMPARISON:  Chest and right rib radiographs 07/15/2020 FINDINGS: No fracture or other bone lesions are seen involving the ribs. A BB was placed at site of pain about lower ribs, no rib abnormality subjacent to the BB. There is no evidence of pneumothorax or pleural effusion. Streaky atelectasis involving the right greater than left lung base. No confluent consolidation. Heart size and mediastinal contours are within normal limits. IMPRESSION: 1. Unremarkable radiographic appearance of left ribs. 2. Streaky bibasilar atelectasis. Electronically Signed   By: Narda RutherfordMelanie  Sanford M.D.   On: 04/23/2021 18:06   CT HEAD WO CONTRAST  Result Date: 04/24/2021 CLINICAL DATA:  Neurologic deficit Migraines Vision changes EXAM: CT HEAD WITHOUT CONTRAST TECHNIQUE: Contiguous axial images were obtained from the base of the skull through the vertex without intravenous contrast. COMPARISON:  None. FINDINGS: Brain: No evidence of acute infarction, hemorrhage, hydrocephalus, extra-axial collection or mass lesion/mass effect. Vascular: No hyperdense vessel or unexpected calcification. Skull: Normal. Negative for fracture or focal lesion. Sinuses/Orbits: Rounded opacity in the floor the right maxillary sinus may be a mucous retention cyst or polyp. Visualized paranasal sinuses and mastoid air cells otherwise well aerated. Other: None. IMPRESSION: No acute intracranial abnormality. Electronically Signed    By: Acquanetta BellingFarhaan  Mir M.D.   On: 04/24/2021 11:24   CT Angio Chest PE W and/or Wo Contrast  Result Date: 04/24/2021 CLINICAL DATA:  abdomen pains, esp after receiving morphine as he stated. Patient coming from home complaint of left rib pain for several days. Short of breath and chest pain. EXAM: CT ANGIOGRAPHY CHEST WITH CONTRAST TECHNIQUE: Multidetector CT imaging of the chest was performed using the standard protocol during bolus administration of intravenous contrast. Multiplanar CT image reconstructions and MIPs were obtained to evaluate the vascular anatomy. CONTRAST:  50mL OMNIPAQUE IOHEXOL 350 MG/ML SOLN COMPARISON:  Chest x-ray 04/23/2021. FINDINGS: Cardiovascular: Satisfactory opacification of the pulmonary arteries to the segmental level. No evidence of pulmonary embolism. Normal heart size. No pericardial effusion. Mediastinum/Nodes: Left hilar lymphadenopathy. No enlarged mediastinal, right hilar, or axillary lymph nodes. Thyroid gland, trachea, and esophagus demonstrate no significant findings. Elevated left hemidiaphragm. Lungs/Pleura: Poorly defined left lower lobe base ground-glass airspace opacity. Similar to lesser extent findings within the left lower lobe. Right lower lobe subsegmental atelectasis. Severe paraseptal and centrilobular emphysematous changes. No pleural effusion. No pneumothorax. Upper Abdomen: No acute abnormality. Musculoskeletal: Review of the MIP images confirms the above findings. IMPRESSION: 1. No central or segmental pulmonary embolus. Limited evaluation of the subsegmental level due to motion artifact. 2. Poorly defined left lower lobe base ground-glass airspace opacity suggestive of infection/inflammation. COVID-19 infection not excluded. 3. Likely reactive left hilar lymphadenopathy. Recommend attention on short-term follow-up. 4. Aortic Atherosclerosis (ICD10-I70.0) and Emphysema (ICD10-J43.9).  Electronically Signed   By: Tish Frederickson M.D.   On: 04/24/2021 00:14      Medical Consultants:    None.   Subjective:    [name] continues to have pleuritic chest pain.  Objective:    Vitals:   04/24/21 2200 04/25/21 0136 04/25/21 0608 04/25/21 0811  BP: 121/60 106/63 108/62   Pulse: 68 64 (!) 59   Resp: 18 18 18    Temp: 98.2 F (36.8 C) 98.3 F (36.8 C) 98.3 F (36.8 C)   TempSrc: Oral Oral Oral   SpO2: 94% 94% 95% 96%   SpO2: 96 % O2 Flow Rate (L/min): 2 L/min FiO2 (%): 24 %   Intake/Output Summary (Last 24 hours) at 04/25/2021 04/27/2021 Last data filed at 04/24/2021 1700 Gross per 24 hour  Intake 635.35 ml  Output --  Net 635.35 ml   There were no vitals filed for this visit.  Exam: General exam: In no acute distress. Respiratory system: Good air movement and clear to auscultation. Cardiovascular system: S1 & S2 heard, RRR. No JVD.  Gastrointestinal system: Abdomen is nondistended, soft and nontender.  Extremities: No pedal edema. Skin: No rashes, lesions or ulcers Psychiatry: Judgement and insight appear normal. Mood & affect appropriate.    Data Reviewed:    Labs: Basic Metabolic Panel: Recent Labs  Lab 04/23/21 2153 04/25/21 0423  NA 136 138  K 4.8 4.3  CL 100 104  CO2 26 28  GLUCOSE 122* 112*  BUN 12 13  CREATININE 1.16 0.99  CALCIUM 9.3 9.2   GFR CrCl cannot be calculated (Unknown ideal weight.). Liver Function Tests: Recent Labs  Lab 04/23/21 2153 04/25/21 0423  AST 28 34  ALT 27 101*  ALKPHOS 73 131*  BILITOT 1.8* 0.8  PROT 7.9 7.0  ALBUMIN 3.6 3.1*   Recent Labs  Lab 04/23/21 2153  LIPASE 28   No results for input(s): AMMONIA in the last 168 hours. Coagulation profile No results for input(s): INR, PROTIME in the last 168 hours. COVID-19 Labs  No results for input(s): DDIMER, FERRITIN, LDH, CRP in the last 72 hours.  Lab Results  Component Value Date   SARSCOV2NAA NEGATIVE 04/24/2021   SARSCOV2NAA Detected (A) 01/18/2020   SARSCOV2NAA NEGATIVE 08/26/2019     CBC: Recent Labs  Lab 04/23/21 2153 04/25/21 0423  WBC 10.9* 10.1  NEUTROABS 8.6*  --   HGB 15.4 13.6  HCT 44.7 39.7  MCV 94.1 92.8  PLT 281 287   Cardiac Enzymes: No results for input(s): CKTOTAL, CKMB, CKMBINDEX, TROPONINI in the last 168 hours. BNP (last 3 results) No results for input(s): PROBNP in the last 8760 hours. CBG: No results for input(s): GLUCAP in the last 168 hours. D-Dimer: No results for input(s): DDIMER in the last 72 hours. Hgb A1c: No results for input(s): HGBA1C in the last 72 hours. Lipid Profile: No results for input(s): CHOL, HDL, LDLCALC, TRIG, CHOLHDL, LDLDIRECT in the last 72 hours. Thyroid function studies: No results for input(s): TSH, T4TOTAL, T3FREE, THYROIDAB in the last 72 hours.  Invalid input(s): FREET3 Anemia work up: No results for input(s): VITAMINB12, FOLATE, FERRITIN, TIBC, IRON, RETICCTPCT in the last 72 hours. Sepsis Labs: Recent Labs  Lab 04/23/21 2152 04/23/21 2153 04/24/21 0110 04/24/21 0851 04/25/21 0423  PROCALCITON  --   --   --  <0.10  --   WBC  --  10.9*  --   --  10.1  LATICACIDVEN 1.9  --  0.7  --   --  Microbiology Recent Results (from the past 240 hour(s))  Resp Panel by RT-PCR (Flu A&B, Covid) Nasopharyngeal Swab     Status: None   Collection Time: 04/24/21  1:10 AM   Specimen: Nasopharyngeal Swab; Nasopharyngeal(NP) swabs in vial transport medium  Result Value Ref Range Status   SARS Coronavirus 2 by RT PCR NEGATIVE NEGATIVE Final    Comment: (NOTE) SARS-CoV-2 target nucleic acids are NOT DETECTED.  The SARS-CoV-2 RNA is generally detectable in upper respiratory specimens during the acute phase of infection. The lowest concentration of SARS-CoV-2 viral copies this assay can detect is 138 copies/mL. A negative result does not preclude SARS-Cov-2 infection and should not be used as the sole basis for treatment or other patient management decisions. A negative result may occur with  improper  specimen collection/handling, submission of specimen other than nasopharyngeal swab, presence of viral mutation(s) within the areas targeted by this assay, and inadequate number of viral copies(<138 copies/mL). A negative result must be combined with clinical observations, patient history, and epidemiological information. The expected result is Negative.  Fact Sheet for Patients:  BloggerCourse.com  Fact Sheet for Healthcare Providers:  SeriousBroker.it  This test is no t yet approved or cleared by the [place] FDA and  has been authorized for detection and/or diagnosis of SARS-CoV-2 by FDA under an Emergency Use Authorization (EUA). This EUA will remain  in effect (meaning this test can be used) for the duration of the COVID-19 declaration under Section 564(b)(1) of the Act, 21 U.S.C.section 360bbb-3(b)(1), unless the authorization is terminated  or revoked sooner.       Influenza A by PCR NEGATIVE NEGATIVE Final   Influenza B by PCR NEGATIVE NEGATIVE Final    Comment: (NOTE) The Xpert Xpress SARS-CoV-2/FLU/RSV plus assay is intended as an aid in the diagnosis of influenza from Nasopharyngeal swab specimens and should not be used as a sole basis for treatment. Nasal washings and aspirates are unacceptable for Xpert Xpress SARS-CoV-2/FLU/RSV testing.  Fact Sheet for Patients: BloggerCourse.com  Fact Sheet for Healthcare Providers: SeriousBroker.it  This test is not yet approved or cleared by the [place] FDA and has been authorized for detection and/or diagnosis of SARS-CoV-2 by FDA under an Emergency Use Authorization (EUA). This EUA will remain in effect (meaning this test can be used) for the duration of the COVID-19 declaration under Section 564(b)(1) of the Act, 21 U.S.C. section 360bbb-3(b)(1), unless the authorization is terminated or revoked.  Performed at  [facility], [address], [place], [place] [address]      Medications:   . arformoterol  15 mcg Nebulization BID  . budesonide (PULMICORT) nebulizer solution  0.5 mg Nebulization BID  . enoxaparin (LOVENOX) injection  40 mg Subcutaneous Q24H  . guaiFENesin  600 mg Oral BID  . ipratropium-albuterol  3 mL Nebulization QID  . predniSONE  40 mg Oral Q breakfast   Continuous Infusions: . azithromycin (ZITHROMAX) 500 MG IVPB (Vial-Mate [employer])    . cefTRIAXone (ROCEPHIN)  IV        LOS: 1 day   Marinda Elk  [facility]  04/25/2021, 8:22 AM

## 2021-04-25 NOTE — Progress Notes (Signed)
Initial Nutrition Assessment  DOCUMENTATION CODES:  Not applicable  INTERVENTION:  Please obtain new weight.  Continue regular diet order.  Add Ensure Enlive po daily, each supplement provides 350 kcal and 20 grams of protein.  Add Magic cup TID with meals, each supplement provides 290 kcal and 9 grams of protein.  Add MVI with minerals daily.  NUTRITION DIAGNOSIS:  Inadequate oral intake related to social / environmental circumstances as evidenced by per patient/family report.  GOAL:  Patient will meet greater than or equal to 90% of their needs  MONITOR:  PO intake,Supplement acceptance,Labs,Weight trends,I & O's  REASON FOR ASSESSMENT:  Consult Assessment of nutrition requirement/status  ASSESSMENT:  55 yo male with a PMH of COPD, ongoing tobacco and EtOH abuse, and previous COVID-19 infection in 2021 who presents with acute respiratory failure with hypoxia 2/2 CAP.  Spoke with pt at bedside. Pt in good spirits. He reports that he has a great appetite, but he has been eating significantly less recently because of significant personal stress. His wife has been having strokes, so he has been taking care of his children and he just became a grandfather on Sunday as one of his daughters just had twins. He also reports having to commute to [place], [place] daily for one of his daughters. He has a hard time sitting down for a meal because his job is calling him or his family needs something.   Per Epic, pt ate 75% of lunch and 100% of dinner yesterday and 50% of breakfast this morning. He reports not liking the breakfast this morning because he does not eat pork, and his [place] tasted like pork. He reports that meat gave him constipation as a child, and he was a vegetarian for most of his life. He eats fish, seafood, chicken, and [place], but typically still avoids red meats. He ate 75% of his lunch this afternoon.  Pt's weight has not been updated since 03/28/21, which he weighed 99 kg  at the time. Recommend obtaining updated weight. RD to use 99 kg to estimate needs. Pt reports his UBW is 205-210 lbs.  On exam, pt had no significant depletions in either fat or muscle stores. Pt not malnourished at this time.  Recommend adding 1 Ensure daily and Magic Cup TID to promote intake, as well as MVI with minerals daily.  Medications: reviewed; azithromycin, prednisone, IVF @ 75 ml/hr, ceftriaxone  Labs: reviewed; Glucose 112  NUTRITION - FOCUSED PHYSICAL EXAM: Flowsheet Row Most Recent Value  Orbital Region No depletion  Upper Arm Region No depletion  Thoracic and Lumbar Region No depletion  Buccal Region No depletion  Temple Region No depletion  Clavicle Bone Region Mild depletion  Clavicle and Acromion Bone Region No depletion  Scapular Bone Region No depletion  Dorsal Hand No depletion  Patellar Region No depletion  Anterior Thigh Region No depletion  Posterior Calf Region No depletion  Edema (RD Assessment) None  Hair Reviewed  Eyes Reviewed  Mouth Reviewed  Skin Reviewed  Nails Reviewed     Diet Order:   Diet Order            Diet regular Room service appropriate? Yes; Fluid consistency: Thin  Diet effective now                EDUCATION NEEDS:  Education needs have been addressed  Skin:  Skin Assessment: Reviewed RN Assessment  Last BM:  04/21/21  Height:  Ht Readings from Last 1 Encounters:  03/28/20 5\' 9"  (  1.753 m)   Weight:  Wt Readings from Last 1 Encounters:  03/28/20 99 kg   Ideal Body Weight:  72.7 kg  BMI:  There is no height or weight on file to calculate BMI.  Estimated Nutritional Needs:  Kcal:  2000-2200 Protein:  105-120 grams Fluid:  >2 L  Vertell Limber, RD, LDN Registered Dietitian After Hours/Weekend Pager # in Amion

## 2021-04-25 NOTE — Discharge Instructions (Signed)
Suggestions For Increasing Calories And Protein . Several small meals a day are easier to eat and digest than three large ones. Space meals about 2 to 3 hours apart to maximize comfort. . Stop eating 2 to 3 hours before bed and sleep with your head elevated if gastric reflux (GERD) and heartburn are problems. . Do not eat your favorite foods if you are feeling bad. Save them for when you feel good! . Eat breakfast-type foods at any meal. Eggs are usually easy to eat and are great any time of the day. (The same goes for pancakes and waffles.) . Eat when you feel hungry. Most people have the greatest appetite in the morning because they have not eaten all night. If this is the best meal for you, then pile on those calories and other nutrients in the morning and at lunch. Then you can have a smaller dinner without losing total calories for the day. . Eat leftovers or nutritious snacks in the afternoon and early evening to round out your day. . Try homemade or commercially prepared nutrition bars and puddings, as well as calorie- and protein-rich liquid nutritional supplements. Benefits of Physical Activity Talk to your doctor about physical activity. Light or moderate physical activity can help maintain muscle and promote an appetite. Walking in the neighborhood or the local mall is a great way to get up, get out, and get moving. If you are unsteady on your feet, try walking around the dining room table. Save Room for [employer]! Drink most fluids between meals instead of with meals. (It is fine to have a sip to help swallow food at meal time.) Fluids (which usually have fewer calories and nutrients than solid food) can take up valuable space in your stomach.  Foods Recommended High-Protein Foods Milk products Add dairy-free cheese to toast, crackers, sandwiches, baked potatoes, vegetables, soups, noodles, meat, and fruit. Use almond/coconut milk in place of water when cooking cereal and cream  soups. Include cream sauces on vegetables and pasta. Add powdered milk to cream soups and mashed potatoes.  Eggs Have hard-cooked eggs readily available in the refrigerator. Chop and add to salads, casseroles, soups, and vegetables. Make a quick egg salad. All eggs should be well cooked to avoid the risk of harmful bacteria.  Meats, poultry, and fish Add leftover cooked meats to soups, casseroles, salads, and omelets. Make dip by mixing diced, chopped, or shredded meat with sour cream and spices.  Beans, legumes, nuts, and seeds Sprinkle nuts and seeds on cereals, fruit, and desserts such as ice cream, pudding, and custard. Also serve nuts and seeds on vegetables, salads, and pasta. Spread peanut butter on toast, bread, English muffins, and fruit, or blend it in a milk shake. Add beans and peas to salads, soups, casseroles, and vegetable dishes.  High-Calorie Foods Butter, margarine, and  oils Melt butter or margarine over potatoes, rice, pasta, and cooked vegetables. Add melted butter or margarine into soups and casseroles and spread on bread for sandwiches before spreading sandwich spread or peanut butter. Saut or stir-fry vegetables, meats, chicken and fish such as shrimp/scallops in olive or canola oil. A variety of oils add calories and can be used to [employer], chicken, or fish.  Milk products Add dairy free whipping cream to desserts, pancakes, waffles, fruit, and hot chocolate, and fold it into soups and casseroles. Add sour cream to baked potatoes and vegetables.  Salad dressing Use regular (not low-fat or diet) mayonnaise and salad dressing on sandwiches and  in dips with vegetables and fruit.   Sweets Add jelly and honey to bread and crackers. Add jam to fruit and ice cream and as a topping over cake.   Copyright 2020  [employer]. All rights reserved.

## 2021-04-25 NOTE — Progress Notes (Signed)
PHARMACIST - PHYSICIAN COMMUNICATION DR:   Feliz Ortiz CONCERNING: Antibiotic IV to Oral Route Change Policy  RECOMMENDATION: This patient is receiving azithromycin by the intravenous route.  Based on criteria approved by the Pharmacy and Therapeutics Committee, the antibiotic(s) is/are being converted to the equivalent oral dose form(s).   DESCRIPTION: These criteria include:  Patient being treated for a respiratory tract infection, urinary tract infection, cellulitis or clostridium difficile associated diarrhea if on metronidazole  The patient is not neutropenic and does not exhibit a GI malabsorption state  The patient is eating (either orally or via tube) and/or has been taking other orally administered medications for a least 24 hours  The patient is improving clinically and has a Tmax < 100.5  If you have questions about this conversion, please contact the Pharmacy Department  []  ( [phone] )  [place] []  ( [phone] )  [facility] [x]  ( [phone] )  [place] []  ( [phone] )  [facility] []  ( [phone] )  [facility]   

## 2021-04-25 NOTE — Evaluation (Signed)
Physical Therapy Evaluation Patient Details Name: [name] MRN: [id number] DOB: January 19, 1966 Today's Date: 04/25/2021   History of Present Illness  55 y.o. male presents to ED 04/23/21 with complaints of left-sided rib pain for the last 2-3 days. In ED O2 found to be 87%O2 on 4L O2, CT concerning for LLL PNA. Admitted for hypoxia secondary to [facility] PMH: COPD, migraine headaches, and history of COVID-19 back in 2021  Clinical Impression  Patient evaluated by Physical Therapy with no further acute PT needs identified. All education has been completed and the patient has no further questions. Pt has no follow-up Physical Therapy or equipment needs. PT is signing off. Thank you for this referral.     Follow Up Recommendations No PT follow up;Supervision - Intermittent    Equipment Recommendations  None recommended by PT       Precautions / Restrictions Precautions Precautions: None Restrictions Weight Bearing Restrictions: No      Mobility  Bed Mobility Overal bed mobility: Modified Independent             General bed mobility comments: HoB elevated    Transfers Overall transfer level: Independent                  Ambulation/Gait Ambulation/Gait assistance: Modified independent (Device/Increase time) Gait Distance (Feet): 100 Feet Assistive device: None;IV Pole Gait Pattern/deviations: WFL(Within Functional Limits);Step-through pattern Gait velocity: WFL Gait velocity interpretation: 1.31 - 2.62 ft/sec, indicative of limited community ambulator General Gait Details: began ambulation without AD with no deficits, prefers to push IV pole  Stairs Stairs: Yes Stairs assistance: Modified independent (Device/Increase time) Stair Management: One rail Left;Alternating pattern Number of Stairs: 16 (1 step 16 times due to IV pole) General stair comments: SaO2 dropped to 85%O2 with stair climb, however able to recover to 91%O2 within 1 min         Balance  Overall balance assessment: Independent                                           Pertinent Vitals/Pain Pain Assessment: 0-10 Pain Score: 3  Pain Location: L lower chest Pain Descriptors / Indicators: Aching;Cramping Pain Intervention(s): Limited activity within patient's tolerance;Monitored during session;Repositioned    Home Living Family/patient expects to be discharged to:: Private residence Living Arrangements: Children;Spouse/significant other Available Help at Discharge: Available PRN/intermittently Type of Home: House Home Access: Stairs to enter Entrance Stairs-Rails: Can reach both Entrance Stairs-Number of Steps: 16 Home Layout: Multi-level;Able to live on main level with bedroom/bathroom Home Equipment: Hand held shower head;Grab bars - tub/shower      Prior Function Level of Independence: Independent         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 Extremity Assessment Upper Extremity Assessment: Overall WFL for tasks assessed    Lower Extremity Assessment Lower Extremity Assessment: Overall WFL for tasks assessed       Communication   Communication: No difficulties  Cognition Arousal/Alertness: Awake/alert Behavior During Therapy: WFL for tasks assessed/performed Overall Cognitive Status: Within Functional Limits for tasks assessed                                        General Comments General comments (skin integrity, edema, etc.):  Pt on RA on entry with SaO2 92%O2, with ambulation SaO2 dropped to 88%O2 able to quickly recover with purse lip breathing, practiced stairs with SaO2 drop to 85%O2, purse lip breathing recovers to 90%O2, provide education on COPD and purse lip breathing        Assessment/Plan    PT Assessment Patent does not need any further PT services         PT Goals (Current goals can be found in the Care Plan section)  Acute Rehab PT Goals Patient Stated  Goal: go home to see new granddaughters PT Goal Formulation: With patient Time For Goal Achievement: 05/09/21 Potential to Achieve Goals: Good     AM-PAC PT "6 Clicks" Mobility  Outcome Measure Help needed turning from your back to your side while in a flat bed without using bedrails?: None Help needed moving from lying on your back to sitting on the side of a flat bed without using bedrails?: None Help needed moving to and from a bed to a chair (including a wheelchair)?: None Help needed standing up from a chair using your arms (e.g., wheelchair or bedside chair)?: None Help needed to walk in hospital room?: None Help needed climbing 3-5 steps with a railing? : None 6 Click Score: 24    End of Session   Activity Tolerance: Patient tolerated treatment well Patient left: in bed;with call bell/phone within reach Nurse Communication: Mobility status;Other (comment) (oxygen desaturation with activity) PT Visit Diagnosis: Muscle weakness (generalized) (M62.81)    Time: 6010-9323 PT Time Calculation (min) (ACUTE ONLY): 21 min   Charges:   PT Evaluation $PT Eval Moderate Complexity: 1 Mod          Corgan Mormile B. Beverely Risen PT, DPT Acute Rehabilitation Services Pager [phone] Office [phone]   Elon Alas Fleet 04/25/2021, 1:41 PM

## 2021-04-26 DIAGNOSIS — I959 Hypotension, unspecified: Secondary | ICD-10-CM

## 2021-04-26 DIAGNOSIS — F17200 Nicotine dependence, unspecified, uncomplicated: Secondary | ICD-10-CM

## 2021-04-26 DIAGNOSIS — J441 Chronic obstructive pulmonary disease with (acute) exacerbation: Secondary | ICD-10-CM

## 2021-04-26 DIAGNOSIS — J9601 Acute respiratory failure with hypoxia: Secondary | ICD-10-CM

## 2021-04-26 DIAGNOSIS — R7989 Other specified abnormal findings of blood chemistry: Secondary | ICD-10-CM

## 2021-04-26 DIAGNOSIS — F1010 Alcohol abuse, uncomplicated: Secondary | ICD-10-CM

## 2021-04-26 MED ORDER — PREDNISONE 20 MG PO TABS: 40.0000 mg | ORAL_TABLET | Freq: Every day | ORAL | 0 refills | Status: AC

## 2021-04-26 MED ORDER — AMOXICILLIN-POT CLAVULANATE 875-125 MG PO TABS: 1.0000 | ORAL_TABLET | Freq: Two times a day (BID) | ORAL | 0 refills | Status: AC

## 2021-04-26 MED ORDER — AZITHROMYCIN 500 MG PO TABS: 500.0000 mg | ORAL_TABLET | Freq: Every day | ORAL | 0 refills | Status: AC

## 2021-04-26 MED ORDER — GUAIFENESIN ER 600 MG PO TB12: 600.0000 mg | ORAL_TABLET | Freq: Two times a day (BID) | ORAL | 0 refills | Status: AC

## 2021-04-26 NOTE — Discharge Summary (Signed)
Physician Discharge Summary  Peter LimesCharles T Guerra WGN:[id number] DOB: [birth year] DOA: 04/23/2021  PCP: [facility]  Admit date: 04/23/2021 Discharge date: 04/26/2021  Admitted From: Home Disposition: Home  Recommendations for Outpatient Follow-up:  1. Follow ups as below. 2. Please obtain CBC/CMP/Mag at follow up 3. Please follow up on the following pending results: None  Home Health: None required Equipment/Devices: None required  Discharge Condition: Stable CODE STATUS: Full code    Hospital Course: 55 year old M with PMH of COPD not on oxygen, ongoing tobacco abuse, alcohol abuse and COVID-19 infection presenting with left-sided chest pain for 3 days, and admitted with acute hypoxic respiratory failure due to COPD exacerbation and left lower lobe pneumonia.  CTA chest negative for PE but left lower lobe opacity consistent with pneumonia.    Patient was a started on IV ceftriaxone, azithromycin and systemic steroid with improvement in his symptoms.  He was liberated of oxygen.  He maintained appropriate saturation with ambulation on room air.  Discharged on p.o. Augmentin, azithromycin and prednisone for the next 3 days to complete treatment course.  Patient to continue with his breathing treatment for COPD.  He has been counseled on tobacco and alcohol cessation.  Recommend rechecking LFT at follow-up.  See individual problem list below for more hospital course. Discharge Diagnoses:  Acute respiratory failure with hypoxia due to community-acquired pneumonia.  85% on RA. COPD exacerbation likely due to community-acquired pneumonia and ongoing tobacco use -Received IV ceftriaxone and azithromycin for 2 days -P.o. Augmentin for 3 more days -P.o. azithromycin for 2 more days -P.o. prednisone for 3 more days -Continue home COPD medications (LAMA/LABA/ICS) with rescue inhalers -Encourage tobacco cessation  Transient hypotension: Felt to be due to IV Dilaudid  he received in ED.  Resolved after normal saline bolus.  Migraine headache: Resolved.  CT head without acute finding. -Continue home medications  Elevated ALT/hyperbilirubinemia: Hyperbilirubinemia resolved. -Recheck LFT at follow-up  Tobacco use disorder -Encourage cessation  Alcohol abuse -Encouraged cessation  There is no height or weight on file to calculate BMI. Nutrition Problem: Inadequate oral intake Etiology: social / environmental circumstances Signs/Symptoms: per patient/family report Interventions: Ensure Enlive (each supplement provides 350kcal and 20 grams of protein),Magic cup,MVI      Discharge Exam: Vitals:   04/26/21 0852 04/26/21 1000  BP:    Pulse: 66   Resp: 20   Temp:    SpO2: 99% 100%    GENERAL: No apparent distress.  Nontoxic. HEENT: MMM.  Vision and hearing grossly intact.  NECK: Supple.  No apparent JVD.  RESP: On RA.  No IWOB.  Fair aeration bilaterally. CVS:  RRR. Heart sounds normal.  ABD/GI/GU: Bowel sounds present. Soft. Non tender.  MSK/EXT:  Moves extremities. No apparent deformity. No edema.  SKIN: no apparent skin lesion or wound NEURO: Awake, alert and oriented appropriately.  No apparent focal neuro deficit. PSYCH: Calm. Normal affect.  Discharge Instructions  Discharge Instructions    Call MD for:  difficulty breathing, headache or visual disturbances   Complete by: As directed    Call MD for:  extreme fatigue   Complete by: As directed    Call MD for:  severe uncontrolled pain   Complete by: As directed    Call MD for:  temperature >100.4   Complete by: As directed    Diet general   Complete by: As directed    Discharge instructions   Complete by: As directed    It has been a pleasure  taking care of you!  You were hospitalized with left-sided chest pain likely from pneumonia.  We have treated you with antibiotics and breathing treatments, and your symptoms improved to the point we think it is safe to let you go home  and follow-up with your primary care doctor.  We are discharging you more antibiotics and prednisone to complete treatment course.  It is very important that you take these and your other medications as prescribed.   Please follow-up with your primary care doctor in 1 to 2 weeks or sooner if needed.   Take care,   Increase activity slowly   Complete by: As directed      Allergies as of 04/26/2021      Reactions   Tomato Hives, Itching      Medication List    TAKE these medications   acetaminophen 325 MG tablet Commonly known as: TYLENOL Take 650 mg by mouth every 6 (six) hours as needed for moderate pain.   albuterol 108 (90 Base) MCG/ACT inhaler Commonly known as: VENTOLIN HFA TAKE 2 PUFFS BY MOUTH EVERY 6 HOURS AS NEEDED FOR WHEEZE OR SHORTNESS OF BREATH What changed: See the new instructions.   amoxicillin-clavulanate 875-125 MG tablet Commonly known as: Augmentin Take 1 tablet by mouth 2 (two) times daily for 3 days.   azithromycin 500 MG tablet Commonly known as: ZITHROMAX Take 1 tablet (500 mg total) by mouth daily for 2 days.   Breztri Aerosphere 160-9-4.8 MCG/ACT Aero Generic drug: Budeson-Glycopyrrol-Formoterol Inhale 2 puffs into the lungs 2 (two) times daily.   guaiFENesin 600 MG 12 hr tablet Commonly known as: MUCINEX Take 1 tablet (600 mg total) by mouth 2 (two) times daily for 5 days.   ipratropium-albuterol 0.5-2.5 (3) MG/3ML Soln Commonly known as: DUONEB TAKE 3 MLS BY NEBULIZATION EVERY 6 (SIX) HOURS AS NEEDED. What changed: reasons to take this   predniSONE 20 MG tablet Commonly known as: DELTASONE Take 2 tablets (40 mg total) by mouth daily with breakfast for 3 days. Start taking on: Apr 27, 2021       Consultations:  None  Procedures/Studies:  None   CT ABDOMEN PELVIS WO CONTRAST  Result Date: 04/24/2021 CLINICAL DATA:  Diverticulitis suspected. Left anterior T7 area pain. EXAM: CT ABDOMEN AND PELVIS WITHOUT CONTRAST TECHNIQUE:  Multidetector CT imaging of the abdomen and pelvis was performed following the standard protocol without IV contrast. COMPARISON:  CT angiography chest 04/23/2021 FINDINGS: Lower chest: Interval development of trace bilateral pleural effusions. Bilateral lower lobe atelectasis. Emphysema. Hepatobiliary: No focal liver abnormality is seen. Status post cholecystectomy. No biliary dilatation. Pancreas: No focal lesion. Normal pancreatic contour. No surrounding inflammatory changes. No main pancreatic ductal dilatation. Spleen: Normal in size without focal abnormality. Adrenals/Urinary Tract: No adrenal nodule bilaterally. Excretion of previously administered intravenous contrast noted bilaterally. 2.1 cm fluid density lesion within left kidney likely represents a simple renal cyst. No nephrolithiasis, no hydronephrosis, and no contour-deforming renal mass. No ureterolithiasis or hydroureter. The urinary bladder is unremarkable. Stomach/Bowel: PO contrast reaches the mid small bowel. Stomach is within normal limits. No evidence of bowel wall thickening or dilatation. Appendix appears normal. Vascular/Lymphatic: No abdominal aorta or iliac aneurysm. Mild atherosclerotic plaque of the aorta and its branches. No abdominal, pelvic, or inguinal lymphadenopathy. Reproductive: Prostate is unremarkable. Other: No intraperitoneal free fluid. No intraperitoneal free gas. No organized fluid collection. Musculoskeletal: No acute or significant osseous findings. IMPRESSION: 1. No acute intra-abdominal or intrapelvic abnormality with limited evaluation due to noncontrast study  and motion artifact. 2. Aortic Atherosclerosis (ICD10-I70.0) and Emphysema (ICD10-J43.9). Electronically Signed   By: Tish Frederickson M.D.   On: 04/24/2021 03:57   DG Ribs Unilateral W/Chest Left  Result Date: 04/23/2021 CLINICAL DATA:  Left rib pain for several days. EXAM: LEFT RIBS AND CHEST - 3+ VIEW COMPARISON:  Chest and right rib radiographs  07/15/2020 FINDINGS: No fracture or other bone lesions are seen involving the ribs. A BB was placed at site of pain about lower ribs, no rib abnormality subjacent to the BB. There is no evidence of pneumothorax or pleural effusion. Streaky atelectasis involving the right greater than left lung base. No confluent consolidation. Heart size and mediastinal contours are within normal limits. IMPRESSION: 1. Unremarkable radiographic appearance of left ribs. 2. Streaky bibasilar atelectasis. Electronically Signed   By: Narda Rutherford M.D.   On: 04/23/2021 18:06   CT HEAD WO CONTRAST  Result Date: 04/24/2021 CLINICAL DATA:  Neurologic deficit Migraines Vision changes EXAM: CT HEAD WITHOUT CONTRAST TECHNIQUE: Contiguous axial images were obtained from the base of the skull through the vertex without intravenous contrast. COMPARISON:  None. FINDINGS: Brain: No evidence of acute infarction, hemorrhage, hydrocephalus, extra-axial collection or mass lesion/mass effect. Vascular: No hyperdense vessel or unexpected calcification. Skull: Normal. Negative for fracture or focal lesion. Sinuses/Orbits: Rounded opacity in the floor the right maxillary sinus may be a mucous retention cyst or polyp. Visualized paranasal sinuses and mastoid air cells otherwise well aerated. Other: None. IMPRESSION: No acute intracranial abnormality. Electronically Signed   By: Acquanetta Belling M.D.   On: 04/24/2021 11:24   CT Angio Chest PE W and/or Wo Contrast  Result Date: 04/24/2021 CLINICAL DATA:  abdomen pains, esp after receiving morphine as he stated. Patient coming from home complaint of left rib pain for several days. Short of breath and chest pain. EXAM: CT ANGIOGRAPHY CHEST WITH CONTRAST TECHNIQUE: Multidetector CT imaging of the chest was performed using the standard protocol during bolus administration of intravenous contrast. Multiplanar CT image reconstructions and MIPs were obtained to evaluate the vascular anatomy. CONTRAST:  69mL  OMNIPAQUE IOHEXOL 350 MG/ML SOLN COMPARISON:  Chest x-ray 04/23/2021. FINDINGS: Cardiovascular: Satisfactory opacification of the pulmonary arteries to the segmental level. No evidence of pulmonary embolism. Normal heart size. No pericardial effusion. Mediastinum/Nodes: Left hilar lymphadenopathy. No enlarged mediastinal, right hilar, or axillary lymph nodes. Thyroid gland, trachea, and esophagus demonstrate no significant findings. Elevated left hemidiaphragm. Lungs/Pleura: Poorly defined left lower lobe base ground-glass airspace opacity. Similar to lesser extent findings within the left lower lobe. Right lower lobe subsegmental atelectasis. Severe paraseptal and centrilobular emphysematous changes. No pleural effusion. No pneumothorax. Upper Abdomen: No acute abnormality. Musculoskeletal: Review of the MIP images confirms the above findings. IMPRESSION: 1. No central or segmental pulmonary embolus. Limited evaluation of the subsegmental level due to motion artifact. 2. Poorly defined left lower lobe base ground-glass airspace opacity suggestive of infection/inflammation. COVID-19 infection not excluded. 3. Likely reactive left hilar lymphadenopathy. Recommend attention on short-term follow-up. 4. Aortic Atherosclerosis (ICD10-I70.0) and Emphysema (ICD10-J43.9). Electronically Signed   By: Tish Frederickson M.D.   On: 04/24/2021 00:14        The results of significant diagnostics from this hospitalization (including imaging, microbiology, ancillary and laboratory) are listed below for reference.     Microbiology: Recent Results (from the past 240 hour(s))  Resp Panel by RT-PCR (Flu A&B, Covid) Nasopharyngeal Swab     Status: None   Collection Time: 04/24/21  1:10 AM   Specimen: Nasopharyngeal Swab;  Nasopharyngeal(NP) swabs in vial transport medium  Result Value Ref Range Status   SARS Coronavirus 2 by RT PCR NEGATIVE NEGATIVE Final    Comment: (NOTE) SARS-CoV-2 target nucleic acids are NOT  DETECTED.  The SARS-CoV-2 RNA is generally detectable in upper respiratory specimens during the acute phase of infection. The lowest concentration of SARS-CoV-2 viral copies this assay can detect is 138 copies/mL. A negative result does not preclude SARS-Cov-2 infection and should not be used as the sole basis for treatment or other patient management decisions. A negative result may occur with  improper specimen collection/handling, submission of specimen other than nasopharyngeal swab, presence of viral mutation(s) within the areas targeted by this assay, and inadequate number of viral copies(<138 copies/mL). A negative result must be combined with clinical observations, patient history, and epidemiological information. The expected result is Negative.  Fact Sheet for Patients:  BloggerCourse.com  Fact Sheet for Healthcare Providers:  SeriousBroker.it  This test is no t yet approved or cleared by the [place] FDA and  has been authorized for detection and/or diagnosis of SARS-CoV-2 by FDA under an Emergency Use Authorization (EUA). This EUA will remain  in effect (meaning this test can be used) for the duration of the COVID-19 declaration under Section 564(b)(1) of the Act, 21 U.S.C.section 360bbb-3(b)(1), unless the authorization is terminated  or revoked sooner.       Influenza A by PCR NEGATIVE NEGATIVE Final   Influenza B by PCR NEGATIVE NEGATIVE Final    Comment: (NOTE) The Xpert Xpress SARS-CoV-2/FLU/RSV plus assay is intended as an aid in the diagnosis of influenza from Nasopharyngeal swab specimens and should not be used as a sole basis for treatment. Nasal washings and aspirates are unacceptable for Xpert Xpress SARS-CoV-2/FLU/RSV testing.  Fact Sheet for Patients: BloggerCourse.com  Fact Sheet for Healthcare Providers: SeriousBroker.it  This test is not yet  approved or cleared by the [place] FDA and has been authorized for detection and/or diagnosis of SARS-CoV-2 by FDA under an Emergency Use Authorization (EUA). This EUA will remain in effect (meaning this test can be used) for the duration of the COVID-19 declaration under Section 564(b)(1) of the Act, 21 U.S.C. section 360bbb-3(b)(1), unless the authorization is terminated or revoked.  Performed at [facility], [address], [place], [place] [address]      Labs:  CBC: Recent Labs  Lab 04/23/21 2153 04/25/21 0423  WBC 10.9* 10.1  NEUTROABS 8.6*  --   HGB 15.4 13.6  HCT 44.7 39.7  MCV 94.1 92.8  PLT 281 287   BMP &GFR Recent Labs  Lab 04/23/21 2153 04/25/21 0423  NA 136 138  K 4.8 4.3  CL 100 104  CO2 26 28  GLUCOSE 122* 112*  BUN 12 13  CREATININE 1.16 0.99  CALCIUM 9.3 9.2   CrCl cannot be calculated (Unknown ideal weight.). Liver & Pancreas: Recent Labs  Lab 04/23/21 2153 04/25/21 0423  AST 28 34  ALT 27 101*  ALKPHOS 73 131*  BILITOT 1.8* 0.8  PROT 7.9 7.0  ALBUMIN 3.6 3.1*   Recent Labs  Lab 04/23/21 2153  LIPASE 28   No results for input(s): AMMONIA in the last 168 hours. Diabetic: No results for input(s): HGBA1C in the last 72 hours. No results for input(s): GLUCAP in the last 168 hours. Cardiac Enzymes: No results for input(s): CKTOTAL, CKMB, CKMBINDEX, TROPONINI in the last 168 hours. No results for input(s): PROBNP in the last 8760 hours. Coagulation Profile: No results  for input(s): INR, PROTIME in the last 168 hours. Thyroid Function Tests: No results for input(s): TSH, T4TOTAL, FREET4, T3FREE, THYROIDAB in the last 72 hours. Lipid Profile: No results for input(s): CHOL, HDL, LDLCALC, TRIG, CHOLHDL, LDLDIRECT in the last 72 hours. Anemia Panel: No results for input(s): VITAMINB12, FOLATE, FERRITIN, TIBC, IRON, RETICCTPCT in the last 72 hours. Urine analysis:    Component Value Date/Time   COLORURINE AMBER (A)  04/24/2021 0918   APPEARANCEUR CLEAR 04/24/2021 0918   LABSPEC 1.032 (H) 04/24/2021 0918   PHURINE 5.0 04/24/2021 0918   GLUCOSEU NEGATIVE 04/24/2021 0918   HGBUR NEGATIVE 04/24/2021 0918   BILIRUBINUR NEGATIVE 04/24/2021 0918   KETONESUR 5 (A) 04/24/2021 0918   PROTEINUR NEGATIVE 04/24/2021 0918   UROBILINOGEN 1.0 02/16/2009 2042   NITRITE NEGATIVE 04/24/2021 0918   LEUKOCYTESUR NEGATIVE 04/24/2021 0918   Sepsis Labs: Invalid input(s): PROCALCITONIN, LACTICIDVEN   Time coordinating discharge: 35 minutes  SIGNED:  Almon Hercules, MD  [facility] 04/26/2021, 11:07 AM  If 7PM-7AM, please contact night-coverage [email or web address]

## 2021-04-26 NOTE — Progress Notes (Signed)
Patient reported the he does not want to continue to wear the telemetry box, patient has been educated and MD has been notified. RN will continue to monitor this patient.

## 2021-06-30 ENCOUNTER — Other Ambulatory Visit: Payer: Self-pay

## 2021-06-30 ENCOUNTER — Ambulatory Visit
Admission: EM | Admit: 2021-06-30 | Discharge: 2021-06-30 | Disposition: A | Discharge status: Home / Self Care | Payer: BC Managed Care – PPO | Attending: Physician Assistant | Admitting: Physician Assistant

## 2021-06-30 DIAGNOSIS — G43001 Migraine without aura, not intractable, with status migrainosus: Secondary | ICD-10-CM

## 2021-06-30 MED ORDER — BUTALBITAL-APAP-CAFFEINE 50-325-40 MG PO TABS: 1.0000 | ORAL_TABLET | Freq: Two times a day (BID) | ORAL | 0 refills | Status: AC | PRN

## 2021-06-30 NOTE — Discharge Instructions (Addendum)
Take Fioricet up to twice a day as needed for headache pain.  Continue taking Tylenol and ibuprofen over-the-counter.  It is very important that if your headache continues you go to the emergency room as we discussed.  Please follow-up with your neurologist as we discussed as they have new treatments for migraine.

## 2021-06-30 NOTE — ED Triage Notes (Signed)
Two month h/o pain in the back of his eyes that has worsened within the last week. Confirms photophobia and watery eyes. Pt reports h/o migraines.

## 2021-06-30 NOTE — ED Provider Notes (Signed)
[name]    CSN: [id number] Arrival date & time: 06/30/21  1728      History   Chief Complaint Chief Complaint  Patient presents with   Eye Pain    HPI [name] is a 55 y.o. male.   Patient presents today with a 92-month history of migraine.  He has a prolonged history of migraines since he was a teenager and reports current symptoms are similar to previous episodes of this condition.  He denies any recent head injury but does have a history of concussions when he was much younger.  Denies any recent medication changes.  He reports associated photophobia, fatigue, severe headache.  Headache pain is rated 10 on a 0-10 pain scale, localized to the area behind both eyes, described as something pulling on his eyes, no relieving factors unified.  He has tried over-the-counter medication for symptom management.  He is not currently followed by a neurologist but has seen them in the past.  He denies aura.  He denies any recent illness prior to symptom onset or additional symptoms including fever, vomiting chest pain, shortness of breath.  He reports the only medication that manages symptoms is narcotics but he has not had them anytime recently.  Patient had CT head 04/2021 that was normal.  Reports that he has had multiple MRIs the last MRI available in epic was 2014.   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 Authorizing Provider  butalbital-acetaminophen-caffeine (FIORICET) 50-325-40 MG tablet Take 1-2 tablets by mouth 2 (two) times daily as needed for up to 3 doses for headache. 06/30/21  Yes Juliano Mceachin K, PA-C  acetaminophen (TYLENOL) 325 MG tablet Take 650 mg by mouth every 6 (six) hours as needed for moderate pain.    [provider]  albuterol (VENTOLIN HFA) 108 (90 Base) MCG/ACT inhaler TAKE 2 PUFFS BY MOUTH EVERY 6 HOURS AS NEEDED FOR WHEEZE OR SHORTNESS OF BREATH Patient taking differently: Inhale 2 puffs into the lungs every 6 (six) hours as needed for wheezing or shortness of breath. 03/31/21   Luciano Cutter, MD  Budeson-Glycopyrrol-Formoterol (BREZTRI AEROSPHERE) 160-9-4.8 MCG/ACT AERO Inhale 2 puffs into the lungs 2 (two) times daily. 03/28/20   Luciano Cutter, MD  ipratropium-albuterol (DUONEB) 0.5-2.5 (3) MG/3ML SOLN TAKE 3 MLS BY NEBULIZATION EVERY 6 (SIX) HOURS AS NEEDED. Patient taking differently: Take 3 mLs by nebulization every 6 (six) hours as needed (shortness of breath / wheezing). 02/26/20   Glenford Bayley, NP    Family History Family History  Problem Relation Age of Onset   Hypertension Mother    Tuberculosis Mother    Diabetes Mother    COPD Mother    COPD Father  Cancer Sister    COPD Brother    Heart attack Brother     Social History Social History   Tobacco Use   Smoking status: Some Days    Packs/day: 0.50    Years: 42.00    Pack years: 21.00    Types: Cigarettes    Last attempt to quit: 01/11/2020    Years since quitting: 1.4   Smokeless tobacco: Never   Tobacco comments:    Currently only smoking 1 to 2 cigarettes/day on average.  Vaping Use   Vaping Use: Never used  Substance Use Topics   Alcohol use: Yes    Alcohol/week: 14.0 standard drinks    Types: 14 Cans of beer per week   Drug use: Yes     Types: Marijuana     Allergies   Tomato   Review of Systems Review of Systems  Constitutional:  Positive for activity change. Negative for appetite change, fatigue and fever.  Eyes:  Positive for photophobia and visual disturbance.  Respiratory:  Negative for cough and shortness of breath.   Cardiovascular:  Negative for chest pain.  Gastrointestinal:  Positive for nausea. Negative for abdominal pain, diarrhea and vomiting.  Neurological:  Positive for headaches. Negative for dizziness, tremors, seizures, syncope, facial asymmetry, speech difficulty, weakness, light-headedness and numbness.    Physical Exam Triage Vital Signs ED Triage Vitals  Enc Vitals Group     BP 06/30/21 1855 136/87     Pulse Rate 06/30/21 1855 71     Resp 06/30/21 1855 18     Temp 06/30/21 1855 98.1 F (36.7 C)     Temp Source 06/30/21 1855 Oral     SpO2 06/30/21 1855 97 %     Weight --      Height --      Head Circumference --      Peak Flow --      Pain Score 06/30/21 1902 10     Pain Loc --      Pain Edu? --      Excl. in GC? --    No data found.  Updated Vital Signs BP 136/87 (BP Location: Left Arm)   Pulse 71   Temp 98.1 F (36.7 C) (Oral)   Resp 18   SpO2 97%   Visual Acuity Right Eye Distance: 20/25 Left Eye Distance: 20/25 Bilateral Distance: 20/15  Right Eye Near:   Left Eye Near:    Bilateral Near:     Physical Exam Vitals reviewed.  Constitutional:      General: He is awake.     Appearance: Normal appearance. He is normal weight. He is not ill-appearing.     Comments: Very pleasant male appears stated age in no acute distress  HENT:     Head: Normocephalic and atraumatic.     Right Ear: Tympanic membrane, ear canal and external ear normal. Tympanic membrane is not erythematous or bulging.     Left Ear: Tympanic membrane, ear canal and external ear normal. Tympanic membrane is not erythematous or bulging.     Nose: Nose normal.     Mouth/Throat:     Pharynx: Uvula  midline. No oropharyngeal exudate or posterior oropharyngeal erythema.  Eyes:     Extraocular Movements: Extraocular movements intact.     Conjunctiva/sclera: Conjunctivae normal.     Pupils: Pupils are equal, round, and reactive to light.  Cardiovascular:     Rate and Rhythm: Normal rate and regular rhythm.     Heart sounds:  Normal heart sounds, S1 normal and S2 normal. No murmur heard. Pulmonary:     Effort: Pulmonary effort is normal. No accessory muscle usage or respiratory distress.     Breath sounds: Normal breath sounds. No stridor. No wheezing, rhonchi or rales.     Comments: Clear to auscultation bilaterally Musculoskeletal:     Cervical back: Normal range of motion and neck supple.     Comments: Strength 5/5 bilateral upper and lower extremities  Lymphadenopathy:     Head:     Right side of head: No submental, submandibular or tonsillar adenopathy.     Left side of head: No submental, submandibular or tonsillar adenopathy.     Cervical: No cervical adenopathy.  Neurological:     Mental Status: He is alert.     Cranial Nerves: Cranial nerves are intact.     Motor: Motor function is intact.     Coordination: Coordination is intact.     Gait: Gait is intact.     Comments: Cranial nerves II through XII intact.  Psychiatric:        Behavior: Behavior is cooperative.     UC Treatments / Results  Labs (all labs ordered are listed, but only abnormal results are displayed) Labs Reviewed - No data to display  EKG   Radiology No results found.  Procedures Procedures (including critical care time)  Medications Ordered in UC Medications - No data to display  Initial Impression / Assessment and Plan / UC Course  I have reviewed the triage vital signs and the nursing notes.  Pertinent labs & imaging results that were available during my care of the patient were reviewed by me and considered in my medical decision making (see chart for details).      Vital signs and  physical exam reassuring today; no indication for emergent evaluation or imaging.  Patient reports this is not the worst headache of his life.  Discussed that we typically do not prescribe narcotics for migraine so we will try Fioricet given he has tried and failed multiple medications.  Offered prednisone muscle relaxer but states these are ineffective.  He can use over-the-counter analgesics for additional symptom relief.  Discussed that there are many new migraine medications that he should follow-up with neurologist to consider additional treatment options.  Discussed that we are unable to obtain imaging so if he has persistent severe headache or if anything worsens he needs to go to the emergency room.  Strict return precautions given to which patient expressed understanding.   Final Clinical Impressions(s) / UC Diagnoses   Final diagnoses:  Migraine without aura and with status migrainosus, not intractable     Discharge Instructions      Take Fioricet up to twice a day as needed for headache pain.  Continue taking Tylenol and ibuprofen over-the-counter.  It is very important that if your headache continues you go to the emergency room as we discussed.  Please follow-up with your neurologist as we discussed as they have new treatments for migraine.     ED Prescriptions     Medication Sig Dispense Auth. Provider   butalbital-acetaminophen-caffeine (FIORICET) 50-325-40 MG tablet Take 1-2 tablets by mouth 2 (two) times daily as needed for up to 3 doses for headache. 3 tablet Johney Perotti, Noberto Retort, PA-C      PDMP not reviewed this encounter.   Jeani Hawking, PA-C 06/30/21 1931

## 2021-10-30 ENCOUNTER — Other Ambulatory Visit: Payer: Self-pay

## 2021-10-30 ENCOUNTER — Emergency Department (HOSPITAL_COMMUNITY)
Admission: EM | Admit: 2021-10-30 | Discharge: 2021-10-31 | Disposition: A | Discharge status: Home / Self Care | Payer: BC Managed Care – PPO | Attending: Emergency Medicine | Admitting: Emergency Medicine

## 2021-10-30 ENCOUNTER — Encounter (HOSPITAL_COMMUNITY): Payer: Self-pay

## 2021-10-30 ENCOUNTER — Emergency Department (HOSPITAL_COMMUNITY): Payer: BC Managed Care – PPO

## 2021-10-30 DIAGNOSIS — R0602 Shortness of breath: Secondary | ICD-10-CM | POA: Diagnosis present

## 2021-10-30 DIAGNOSIS — Z7951 Long term (current) use of inhaled steroids: Secondary | ICD-10-CM | POA: Diagnosis not present

## 2021-10-30 DIAGNOSIS — J101 Influenza due to other identified influenza virus with other respiratory manifestations: Secondary | ICD-10-CM | POA: Insufficient documentation

## 2021-10-30 DIAGNOSIS — J449 Chronic obstructive pulmonary disease, unspecified: Secondary | ICD-10-CM | POA: Insufficient documentation

## 2021-10-30 DIAGNOSIS — F17210 Nicotine dependence, cigarettes, uncomplicated: Secondary | ICD-10-CM | POA: Diagnosis not present

## 2021-10-30 DIAGNOSIS — Z8616 Personal history of COVID-19: Secondary | ICD-10-CM | POA: Diagnosis not present

## 2021-10-30 DIAGNOSIS — R42 Dizziness and giddiness: Secondary | ICD-10-CM | POA: Insufficient documentation

## 2021-10-30 DIAGNOSIS — Z20822 Contact with and (suspected) exposure to covid-19: Secondary | ICD-10-CM | POA: Insufficient documentation

## 2021-10-30 LAB — CBC WITH DIFFERENTIAL/PLATELET
Abs Immature Granulocytes: 0.02 10*3/uL (ref 0.00–0.07)
Basophils Absolute: 0 10*3/uL (ref 0.0–0.1)
Basophils Relative: 0 %
Eosinophils Absolute: 0.1 10*3/uL (ref 0.0–0.5)
Eosinophils Relative: 1 %
HCT: 41.9 % (ref 39.0–52.0)
Hemoglobin: 15.2 g/dL (ref 13.0–17.0)
Immature Granulocytes: 0 %
Lymphocytes Relative: 15 %
Lymphs Abs: 1 10*3/uL (ref 0.7–4.0)
MCH: 33.5 pg (ref 26.0–34.0)
MCHC: 36.3 g/dL — ABNORMAL HIGH (ref 30.0–36.0)
MCV: 92.3 fL (ref 80.0–100.0)
Monocytes Absolute: 0.4 10*3/uL (ref 0.1–1.0)
Monocytes Relative: 7 %
Neutro Abs: 4.9 10*3/uL (ref 1.7–7.7)
Neutrophils Relative %: 77 %
Platelets: 173 10*3/uL (ref 150–400)
RBC: 4.54 MIL/uL (ref 4.22–5.81)
RDW: 13.4 % (ref 11.5–15.5)
WBC: 6.4 10*3/uL (ref 4.0–10.5)
nRBC: 0 % (ref 0.0–0.2)

## 2021-10-30 LAB — RESP PANEL BY RT-PCR (FLU A&B, COVID) ARPGX2
Influenza A by PCR: POSITIVE — AB
Influenza B by PCR: NEGATIVE
SARS Coronavirus 2 by RT PCR: NEGATIVE

## 2021-10-30 LAB — COMPREHENSIVE METABOLIC PANEL
ALT: 29 U/L (ref 0–44)
AST: 32 U/L (ref 15–41)
Albumin: 3.4 g/dL — ABNORMAL LOW (ref 3.5–5.0)
Alkaline Phosphatase: 56 U/L (ref 38–126)
Anion gap: 11 (ref 5–15)
BUN: 24 mg/dL — ABNORMAL HIGH (ref 6–20)
CO2: 23 mmol/L (ref 22–32)
Calcium: 8.3 mg/dL — ABNORMAL LOW (ref 8.9–10.3)
Chloride: 100 mmol/L (ref 98–111)
Creatinine, Ser: 1.01 mg/dL (ref 0.61–1.24)
GFR, Estimated: >60 mL/min (ref >60)
Glucose, Bld: 104 mg/dL — ABNORMAL HIGH (ref 70–99)
Potassium: 3.4 mmol/L — ABNORMAL LOW (ref 3.5–5.1)
Sodium: 134 mmol/L — ABNORMAL LOW (ref 135–145)
Total Bilirubin: 0.5 mg/dL (ref 0.3–1.2)
Total Protein: 6.8 g/dL (ref 6.5–8.1)

## 2021-10-30 LAB — TROPONIN I (HIGH SENSITIVITY): Troponin I (High Sensitivity): 5 ng/L (ref <18)

## 2021-10-30 MED ORDER — ALBUTEROL SULFATE HFA 108 (90 BASE) MCG/ACT IN AERS
2.0000 | INHALATION_SPRAY | RESPIRATORY_TRACT | Status: DC | PRN
  Administered 2021-10-31: 2 via RESPIRATORY_TRACT

## 2021-10-30 MED ORDER — BENZONATATE 100 MG PO CAPS: 100.0000 mg | ORAL_CAPSULE | Freq: Three times a day (TID) | ORAL | 0 refills | Status: AC

## 2021-10-30 MED ORDER — IPRATROPIUM-ALBUTEROL 0.5-2.5 (3) MG/3ML IN SOLN
RESPIRATORY_TRACT | Status: AC
  Filled 2021-10-30: qty 3

## 2021-10-30 MED ORDER — OSELTAMIVIR PHOSPHATE 75 MG PO CAPS: 75.0000 mg | ORAL_CAPSULE | Freq: Two times a day (BID) | ORAL | 0 refills | Status: AC

## 2021-10-30 MED ORDER — IPRATROPIUM-ALBUTEROL 0.5-2.5 (3) MG/3ML IN SOLN
5.0000 mL | Freq: Once | RESPIRATORY_TRACT | Status: AC
  Administered 2021-10-30: 23:00:00 5 mL via RESPIRATORY_TRACT

## 2021-10-30 MED ORDER — OSELTAMIVIR PHOSPHATE 75 MG PO CAPS
75.0000 mg | ORAL_CAPSULE | Freq: Once | ORAL | Status: AC
  Administered 2021-10-31: 75 mg via ORAL
  Filled 2021-10-30: qty 1

## 2021-10-30 NOTE — ED Triage Notes (Signed)
Pt reports with nausea, vomiting, headache, and dizziness, and chest pain since Friday. Pt states that he has had 2 carosine heaters in the back of his car.

## 2021-10-30 NOTE — ED Provider Notes (Signed)
[facility]-EMERGENCY DEPT Provider Note   CSN: [id number] Arrival date & time: 10/30/21  1759     History Chief Complaint  Patient presents with   Nausea   Emesis   Headache   Dizziness    [name] is a 55 y.o. male.  Patient presents to the ED with a chief complaint of 3 days of nausea, vomiting, and dizziness.  States that he was "bedridden" on Fri-Sun.  States that he feels a bit better today, no longer vomiting.  He reports that he still has a headache and reports that he is having worsening chest pain and shortness of breath.  Has tried OTC cough and cold meds, but denies any successful treatments.  The history is provided by the patient. No language interpreter was used.      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8   Smokeless tobacco: Never   Tobacco comments:    Currently only smoking 1 to 2 cigarettes/day on average.  Vaping Use   Vaping  Use: Never used  Substance Use Topics   Alcohol use: Yes    Alcohol/week: 14.0 standard drinks    Types: 14 Cans of beer per week   Drug use: Yes    Types: Marijuana    Home Medications Prior to Admission medications   Medication Sig Start Date End Date Taking? Authorizing Provider  acetaminophen (TYLENOL) 325 MG tablet Take 650 mg by mouth every 6 (six) hours as needed for moderate pain.    [provider]  albuterol (VENTOLIN HFA) 108 (90 Base) MCG/ACT inhaler TAKE 2 PUFFS BY MOUTH EVERY 6 HOURS AS NEEDED FOR WHEEZE OR SHORTNESS OF BREATH Patient taking differently: Inhale 2 puffs into the lungs every 6 (six) hours as needed for wheezing or shortness of breath. 03/31/21   Luciano Cutter, MD  Budeson-Glycopyrrol-Formoterol (BREZTRI AEROSPHERE) 160-9-4.8 MCG/ACT AERO Inhale 2 puffs into the lungs 2 (two) times daily. 03/28/20   Luciano Cutter, MD  butalbital-acetaminophen-caffeine (FIORICET) [phone] MG tablet Take 1-2 tablets by mouth 2 (two) times daily as needed for up to 3 doses for headache. 06/30/21   Raspet, Denny Peon K, PA-C  ipratropium-albuterol (DUONEB) 0.5-2.5 (3) MG/3ML SOLN TAKE 3 MLS BY NEBULIZATION EVERY  6 (SIX) HOURS AS NEEDED. Patient taking differently: Take 3 mLs by nebulization every 6 (six) hours as needed (shortness of breath / wheezing). 02/26/20   Glenford Bayley, NP    Allergies    Tomato  Review of Systems   Review of Systems  All other systems reviewed and are negative.  Physical Exam Updated Vital Signs BP 132/75   Pulse 95   Temp 98 F (36.7 C) (Oral)   Resp 19   SpO2 99%   Physical Exam Vitals and nursing note reviewed.  Constitutional:      General: He is not in acute distress.    Appearance: He is well-developed.  HENT:     Head: Normocephalic and atraumatic.  Eyes:     Conjunctiva/sclera: Conjunctivae normal.  Cardiovascular:     Rate and Rhythm: Normal rate and regular rhythm.     Heart sounds: No murmur heard. Pulmonary:      Effort: Pulmonary effort is normal. No respiratory distress.     Breath sounds: Wheezing present.  Abdominal:     Palpations: Abdomen is soft.     Tenderness: There is no abdominal tenderness.  Musculoskeletal:        General: No swelling.     Cervical back: Neck supple.  Skin:    General: Skin is warm and dry.     Capillary Refill: Capillary refill takes less than 2 seconds.  Neurological:     Mental Status: He is alert.  Psychiatric:        Mood and Affect: Mood normal.    ED Results / Procedures / Treatments   Labs (all labs ordered are listed, but only abnormal results are displayed) Labs Reviewed  RESP PANEL BY RT-PCR (FLU A&B, COVID) ARPGX2 - Abnormal; Notable for the following components:      Result Value   Influenza A by PCR POSITIVE (*)    All other components within normal limits  CBC WITH DIFFERENTIAL/PLATELET - Abnormal; Notable for the following components:   MCHC 36.3 (*)    All other components within normal limits  COMPREHENSIVE METABOLIC PANEL - Abnormal; Notable for the following components:   Sodium 134 (*)    Potassium 3.4 (*)    Glucose, Bld 104 (*)    BUN 24 (*)    Calcium 8.3 (*)    Albumin 3.4 (*)    All other components within normal limits  URINALYSIS, ROUTINE W REFLEX MICROSCOPIC  TROPONIN I (HIGH SENSITIVITY)  TROPONIN I (HIGH SENSITIVITY)    EKG None  Radiology DG Chest 2 View  Result Date: 10/30/2021 CLINICAL DATA:  Nausea, vomiting, headache, dizziness and chest pain. EXAM: CHEST - 2 VIEW COMPARISON:  CTA chest 04/23/2021 FINDINGS: The cardiac size is normal. No pleural effusion is seen. There is bronchial thickening centrally, increased over prior studies. The lungs are mildly emphysematous, with linear atelectasis or scarring in the left base but without appreciable focal pneumonia. Thoracic cage is intact. IMPRESSION: Central bronchitis without visible focal pneumonia.  COPD. Electronically Signed   By: Almira Bar M.D.   On:  10/30/2021 20:09    Procedures Procedures   Medications Ordered in ED Medications - No data to display  ED Course  I have reviewed the triage vital signs and the nursing notes.  Pertinent labs & imaging results that were available during my care of the patient were reviewed by me and considered in my medical decision making (see chart for details).    MDM Rules/Calculators/A&P  Patient here with cough, fever, generalized body aches.  Positive for influenza A.  He does have some mild wheezing on exam and has history of COPD.  He was given a DuoNeb for this with good improvement of his symptoms.  Will discharge home with treatment for flu.  Will give Tessalon Perles for cough, and sent home with an inhaler.  His vital signs are stable.  He is in no acute distress.  He appears stable for discharge. Final Clinical Impression(s) / ED Diagnoses Final diagnoses:  Influenza A    Rx / DC Orders ED Discharge Orders     None        Roxy Horseman, PA-C 10/31/21 0000    Vanetta Mulders, MD 11/02/21 [phone]

## 2021-10-30 NOTE — ED Provider Notes (Signed)
Emergency Medicine Provider Triage Evaluation Note  [name] , a 55 y.o. male  was evaluated in triage.  Pt complains of right-sided chest pain, productive cough, fever, vomiting of 3-day duration that significantly worsened once he got into the triage room.  Review of Systems  Positive: See above Negative: Lightheadedness, palpitations, diaphoresis  Physical Exam  BP (!) 124/91 (BP Location: Right Arm)   Pulse 96   Temp 98 F (36.7 C) (Oral)   Resp 16   SpO2 97%  Gen:   Awake, no distress   Resp:  Normal effort  MSK:   Moves extremities without difficulty  Other:    Medical Decision Making  Medically screening exam initiated at 7:39 PM.  Appropriate orders placed.  [name] was informed that the remainder of the evaluation will be completed by another provider, this initial triage assessment does not replace that evaluation, and the importance of remaining in the ED until their evaluation is complete.     Marita Kansas, PA-C 10/30/21 1940    Terald Sleeper, MD 10/31/21 0002

## 2021-10-31 DIAGNOSIS — J101 Influenza due to other identified influenza virus with other respiratory manifestations: Secondary | ICD-10-CM | POA: Diagnosis not present

## 2021-10-31 LAB — TROPONIN I (HIGH SENSITIVITY): Troponin I (High Sensitivity): 6 ng/L (ref <18)

## 2021-10-31 MED ORDER — ALBUTEROL SULFATE HFA 108 (90 BASE) MCG/ACT IN AERS
INHALATION_SPRAY | RESPIRATORY_TRACT | Status: AC
  Filled 2021-10-31: qty 6.7

## 2022-05-02 ENCOUNTER — Other Ambulatory Visit: Payer: Self-pay

## 2022-05-02 ENCOUNTER — Emergency Department (HOSPITAL_COMMUNITY)
Admission: EM | Admit: 2022-05-02 | Discharge: 2022-05-03 | Disposition: A | Discharge status: Home / Self Care | Payer: BC Managed Care – PPO | Attending: Emergency Medicine | Admitting: Emergency Medicine

## 2022-05-02 DIAGNOSIS — H5710 Ocular pain, unspecified eye: Secondary | ICD-10-CM | POA: Diagnosis present

## 2022-05-02 DIAGNOSIS — H539 Unspecified visual disturbance: Secondary | ICD-10-CM | POA: Insufficient documentation

## 2022-05-02 NOTE — ED Triage Notes (Signed)
Pt here for bilateral eye pain and redness x1 year. Pt reports unable to see in sunlight, reports photophobia. Pt has had "blood shot eyes" and pain behind both eyes.

## 2022-05-02 NOTE — ED Provider Triage Note (Signed)
Emergency Medicine Provider Triage Evaluation Note  [name] , a 56 y.o. male  was evaluated in triage.  Pt complains of bilateral eye pain and redness.  The patient states that he has been unable to see when the sun is out for a year now.  He also endorses "bloodshot eyes" for the past year.  Patient states that at night he feels like his eyes are swelling and they are very painful.  He used to get migraines and states he no longer gets migraines but has pressure behind his eyes instead.  He states that he was seen by an eye doctor and was told that he had an astigmatism in 1 eye and that they believed many of his problems came from his sinuses.  Patient is unable to elaborate on that at this time.  Review of Systems  Positive: Vision changes, eye pain, eye redness Negative: Headache  Physical Exam  BP 131/80 (BP Location: Right Arm)   Pulse 73   Temp 98.6 F (37 C) (Oral)   Resp 20   SpO2 100%  Gen:   Awake, no distress   Resp:  Normal effort  MSK:   Moves extremities without difficulty  Other:    Medical Decision Making  Medically screening exam initiated at 9:11 PM.  Appropriate orders placed.  [name] was informed that the remainder of the evaluation will be completed by another provider, this initial triage assessment does not replace that evaluation, and the importance of remaining in the ED until their evaluation is complete.     [name] 05/02/22 2113

## 2022-05-03 MED ORDER — TETRACAINE HCL 0.5 % OP SOLN
1.0000 [drp] | Freq: Once | OPHTHALMIC | Status: AC
  Administered 2022-05-03: 1 [drp] via OPHTHALMIC
  Filled 2022-05-03: qty 4

## 2022-05-03 MED ORDER — FLUORESCEIN SODIUM 1 MG OP STRP
1.0000 | ORAL_STRIP | Freq: Once | OPHTHALMIC | Status: AC
  Administered 2022-05-03: 1 via OPHTHALMIC
  Filled 2022-05-03: qty 1

## 2022-05-03 NOTE — Discharge Instructions (Signed)
Call Dr. Sherrine Maples to have formal eye exam scheduled.

## 2022-05-03 NOTE — ED Provider Notes (Signed)
[facility] EMERGENCY DEPARTMENT Provider Note   CSN: [id number] Arrival date & time: 05/02/22  2034     History  Chief Complaint  Patient presents with   Eye Pain    [name] is a 56 y.o. male.  The history is provided by the patient and medical records.  Eye Pain  56 y.o. M here with vision problems.  Reports issues for over a year now.  States has trouble seeing mostly when around bright lights-- like out in the sun, headlights from car, etc.  States sometimes it appears like a "halo" around the eye or sometimes a "rainbow".  States he did recently see the eye doctor (optometrist) for new glasses and was told he had astigmatism.  States eyes seem worse now-- states alternates, one day his left eye is worse, another day his right is worse.  He denies total loss of vision.  He denies any recent chemical exposure to th eye.  He does work for [employer] so often gets dust/debris in his eyes.  He does not currently wear corrective lenses.  No focal eye trauma.  No hx of glaucoma.    Home Medications Prior to Admission medications   Medication Sig Start Date End Date Taking? Authorizing Provider  acetaminophen (TYLENOL) 325 MG tablet Take 650 mg by mouth every 6 (six) hours as needed for moderate pain.    [provider]  albuterol (VENTOLIN HFA) 108 (90 Base) MCG/ACT inhaler TAKE 2 PUFFS BY MOUTH EVERY 6 HOURS AS NEEDED FOR WHEEZE OR SHORTNESS OF BREATH Patient taking differently: Inhale 2 puffs into the lungs every 6 (six) hours as needed for wheezing or shortness of breath. 03/31/21   Luciano Cutter, MD  benzonatate (TESSALON) 100 MG capsule Take 1 capsule (100 mg total) by mouth every 8 (eight) hours. 10/30/21   Roxy Horseman, PA-C  Budeson-Glycopyrrol-Formoterol (BREZTRI AEROSPHERE) 160-9-4.8 MCG/ACT AERO Inhale 2 puffs into the lungs 2 (two) times daily. 03/28/20   Luciano Cutter, MD  butalbital-acetaminophen-caffeine (FIORICET) [phone] MG tablet  Take 1-2 tablets by mouth 2 (two) times daily as needed for up to 3 doses for headache. 06/30/21   Raspet, Denny Peon K, PA-C  ipratropium-albuterol (DUONEB) 0.5-2.5 (3) MG/3ML SOLN TAKE 3 MLS BY NEBULIZATION EVERY 6 (SIX) HOURS AS NEEDED. Patient taking differently: Take 3 mLs by nebulization every 6 (six) hours as needed (shortness of breath / wheezing). 02/26/20   Glenford Bayley, NP  oseltamivir (TAMIFLU) 75 MG capsule Take 1 capsule (75 mg total) by mouth every 12 (twelve) hours. 10/30/21   Roxy Horseman, PA-C      Allergies    Tomato    Review of Systems   Review of Systems  Eyes:  Positive for pain.   Physical Exam Updated Vital Signs BP 129/79   Pulse 82   Temp 97.9 F (36.6 C)   Resp 18   SpO2 98%   Physical Exam Vitals and nursing note reviewed.  Constitutional:      Appearance: He is well-developed.  HENT:     Head: Normocephalic and atraumatic.  Eyes:     Conjunctiva/sclera: Conjunctivae normal.     Pupils: Pupils are equal, round, and reactive to light.     Comments: No lid edema or erythema, no tearing or drainage, eyes are bloodshot (chronic for the past year per patient); EOMs intact, no nystagmus, tracking normally throughout exam, blinks to threat IOP right eye 15, left IOP 12 No fluorescein uptake, no corneal ulcer  or abrasion noted  Cardiovascular:     Rate and Rhythm: Normal rate and regular rhythm.     Heart sounds: Normal heart sounds.  Pulmonary:     Effort: Pulmonary effort is normal.     Breath sounds: Normal breath sounds.  Abdominal:     General: Bowel sounds are normal.     Palpations: Abdomen is soft.  Musculoskeletal:        General: Normal range of motion.     Cervical back: Normal range of motion.  Skin:    General: Skin is warm and dry.  Neurological:     Mental Status: He is alert and oriented to person, place, and time.    ED Results / Procedures / Treatments   Labs (all labs ordered are listed, but only abnormal results are  displayed) Labs Reviewed - No data to display  EKG None  Radiology No results found.  Procedures Procedures    Medications Ordered in ED Medications  fluorescein ophthalmic strip 1 strip (has no administration in time range)  tetracaine (PONTOCAINE) 0.5 % ophthalmic solution 1 drop (has no administration in time range)    ED Course/ Medical Decision Making/ A&P                           Medical Decision Making Risk Prescription drug management.   56 year old male presenting to the ED with visual disturbance.  States he has had trouble with his vision for the past year.  Recently saw optometrist and was told he had astigmatism, given prescription for new glasses.  States he has not yet picked these up but is concerned as his symptoms remain persistent.  He has had no real acute change recently.  Denies total vision loss.  He has not had any trauma to the eye but does have just/debris in his face a lot during the day as he works for [employer].  He is afebrile, nontoxic.  Eyes are bloodshot, reports has been chronic for the past year.  He has no lid edema or erythema, no tearing or drainage.  EOMs are intact without nystagmus.  He is tracking normally throughout the exam, blinks to threat. IOP in both eyes is WNL.  Fluorescein stain is negative bilaterally.  At this point, symptoms present >1 year without acute change recently.  Symptoms occurring bilaterally, changing from one eye to the other intermittently.  Doubt acute CVA.  Will refer to on call ophthalmology for more in-depth eye exam.    Final Clinical Impression(s) / ED Diagnoses Final diagnoses:  Visual disturbance    Rx / DC Orders ED Discharge Orders     None         Garlon Hatchet, PA-C 05/03/22 0430    Nira Conn, MD 05/03/22 [phone]

## 2022-05-03 NOTE — ED Notes (Signed)
Bilateral eye pain  for several months worse without sunglases

## 2022-07-06 ENCOUNTER — Emergency Department (HOSPITAL_COMMUNITY)
Admission: EM | Admit: 2022-07-06 | Discharge: 2022-07-06 | Disposition: A | Discharge status: Home / Self Care | Payer: BC Managed Care – PPO | Attending: Student | Admitting: Student

## 2022-07-06 ENCOUNTER — Encounter (HOSPITAL_COMMUNITY): Payer: Self-pay

## 2022-07-06 DIAGNOSIS — X500XXA Overexertion from strenuous movement or load, initial encounter: Secondary | ICD-10-CM | POA: Diagnosis not present

## 2022-07-06 DIAGNOSIS — M545 Low back pain, unspecified: Secondary | ICD-10-CM | POA: Diagnosis present

## 2022-07-06 DIAGNOSIS — M5442 Lumbago with sciatica, left side: Secondary | ICD-10-CM | POA: Diagnosis not present

## 2022-07-06 MED ORDER — NAPROXEN 500 MG PO TABS: 500.0000 mg | ORAL_TABLET | Freq: Two times a day (BID) | ORAL | 0 refills | Status: AC

## 2022-07-06 NOTE — ED Provider Notes (Signed)
[facility]-EMERGENCY DEPT Provider Note   CSN: [id number] Arrival date & time: 07/06/22  1404     History Pmh; COPD, tobacco use disorder Chief Complaint  Patient presents with   Back Pain    [name] is a 56 y.o. male. Patient presents with lower back pain that started on Monday when he was lifting heavy boxes and helping his daughter move.  He states he was lifting a bed and started to feel backwards and braced himself with his right arm.  He says that his back hyperextended.  This is when he noticed most of his back pain.  Says the most of the pain is on the left side radiating down his left buttocks behind his left knee.  He has had symptoms like this before from prior back injuries that feels similar.  He denies any saddle anesthesia, bowel or bladder dysfunction, gait abnormalities, lower extremity weakness, fevers, chills.  Back Pain      Home Medications Prior to Admission medications   Medication Sig Start Date End Date Taking? Authorizing Provider  naproxen (NAPROSYN) 500 MG tablet Take 1 tablet (500 mg total) by mouth 2 (two) times daily for 7 days. 07/06/22 07/13/22 Yes Celine Dishman, Finis Bud, PA-C  acetaminophen (TYLENOL) 325 MG tablet Take 650 mg by mouth every 6 (six) hours as needed for moderate pain.    [provider]  albuterol (VENTOLIN HFA) 108 (90 Base) MCG/ACT inhaler TAKE 2 PUFFS BY MOUTH EVERY 6 HOURS AS NEEDED FOR WHEEZE OR SHORTNESS OF BREATH Patient taking differently: Inhale 2 puffs into the lungs every 6 (six) hours as needed for wheezing or shortness of breath. 03/31/21   Luciano Cutter, MD  benzonatate (TESSALON) 100 MG capsule Take 1 capsule (100 mg total) by mouth every 8 (eight) hours. 10/30/21   Roxy Horseman, PA-C  Budeson-Glycopyrrol-Formoterol (BREZTRI AEROSPHERE) 160-9-4.8 MCG/ACT AERO Inhale 2 puffs into the lungs 2 (two) times daily. 03/28/20   Luciano Cutter, MD  butalbital-acetaminophen-caffeine  (FIORICET) [phone] MG tablet Take 1-2 tablets by mouth 2 (two) times daily as needed for up to 3 doses for headache. 06/30/21   Raspet, Denny Peon K, PA-C  ipratropium-albuterol (DUONEB) 0.5-2.5 (3) MG/3ML SOLN TAKE 3 MLS BY NEBULIZATION EVERY 6 (SIX) HOURS AS NEEDED. Patient taking differently: Take 3 mLs by nebulization every 6 (six) hours as needed (shortness of breath / wheezing). 02/26/20   Glenford Bayley, NP  oseltamivir (TAMIFLU) 75 MG capsule Take 1 capsule (75 mg total) by mouth every 12 (twelve) hours. 10/30/21   Roxy Horseman, PA-C      Allergies    Tomato    Review of Systems   Review of Systems  Musculoskeletal:  Positive for back pain.  All other systems reviewed and are negative.   Physical Exam Updated Vital Signs BP 128/80 (BP Location: Left Arm)   Pulse 67   Temp 97.7 F (36.5 C) (Oral)   Resp 18   SpO2 100%  Physical Exam Vitals and nursing note reviewed.  Constitutional:      General: He is not in acute distress.    Appearance: Normal appearance. He is well-developed. He is not ill-appearing, toxic-appearing or diaphoretic.  HENT:     Head: Normocephalic and atraumatic.     Nose: No nasal deformity.     Mouth/Throat:     Lips: Pink. No lesions.  Eyes:     General: Gaze aligned appropriately. No scleral icterus.       Right eye:  No discharge.        Left eye: No discharge.     Conjunctiva/sclera: Conjunctivae normal.     Right eye: Right conjunctiva is not injected. No exudate or hemorrhage.    Left eye: Left conjunctiva is not injected. No exudate or hemorrhage. Pulmonary:     Effort: Pulmonary effort is normal. No respiratory distress.  Musculoskeletal:     Comments: No midline tenderness of spine, no stepoff or deformity; reproducible muscular tenderness in left paraspinal muscles DP/PT pulses 2+ and equal bilaterally No leg edema Sensation grossly intact on anterior thighs, dorsum of foot and lateral foot Strength of knee flexion and extension is  5/5 Plantar and dorsiflexion of ankle 5/5 Achilles and patellar reflexes present and equal Gait normal  Skin:    General: Skin is warm and dry.  Neurological:     Mental Status: He is alert and oriented to person, place, and time.  Psychiatric:        Mood and Affect: Mood normal.        Speech: Speech normal.        Behavior: Behavior normal. Behavior is cooperative.     ED Results / Procedures / Treatments   Labs (all labs ordered are listed, but only abnormal results are displayed) Labs Reviewed - No data to display  EKG None  Radiology No results found.  Procedures Procedures   Medications Ordered in ED Medications - No data to display  ED Course/ Medical Decision Making/ A&P                           Medical Decision Making Risk Prescription drug management.   Patient presents with left lower back pain that started after lifting heavy boxes and a bed.  He has no red flag lower back pain symptoms.  His exam is without any midline tenderness, or any other neurological deficits.  I do not think he requires any MRI imaging at this time.  I do long discussion with patient regarding supportive treatment for his symptoms including anti-inflammatories, heating pads, stretching, and other measures.  I have also discussed with him the importance of following up with orthopedic provider in case he does need physical therapy or MRI at some point.  I have given him strict return precautions if he does develop signs that suggest cauda equina.  Patient understands and is stable for discharge.  Final Clinical Impression(s) / ED Diagnoses Final diagnoses:  Acute left-sided low back pain with left-sided sciatica    Rx / DC Orders ED Discharge Orders          Ordered    naproxen (NAPROSYN) 500 MG tablet  2 times daily        07/06/22 1446              Samara Stankowski, Finis Bud, PA-C 07/06/22 1453    Kommor, [place], MD 07/09/22 [phone]

## 2022-07-06 NOTE — ED Triage Notes (Signed)
Pt c/o pain radiating from buttock down to bilateral knees and mid back pain.   8/10 pain   Pt reports he was helping his daughter move into college and he also works at [employer].

## 2022-07-06 NOTE — Discharge Instructions (Addendum)
Your examination today is most concerning for a muscular injury 1. Medications: Start taking naproxen twice a day for no more than 7 days. 650 mg tylenol every 6 hours for pain control, take all usual home medications as they are prescribed 2. Treatment:  Drink plenty of fluids and do plenty of gentle stretching and move the affected muscle through its normal range of motion to prevent stiffness. Use heating pad as needed.  3. Follow Up:  please call Dr. Odis Hollingshead for follow up visit  Please return if you develop problems with your bowel or bladder function, numbness or tingling in your groin/testicular region, or inability to walk.

## 2022-08-28 ENCOUNTER — Emergency Department (HOSPITAL_COMMUNITY): Payer: BC Managed Care – PPO

## 2022-08-28 ENCOUNTER — Encounter (HOSPITAL_COMMUNITY): Payer: Self-pay | Admitting: Emergency Medicine

## 2022-08-28 ENCOUNTER — Other Ambulatory Visit: Payer: Self-pay

## 2022-08-28 ENCOUNTER — Emergency Department (HOSPITAL_COMMUNITY)
Admission: EM | Admit: 2022-08-28 | Discharge: 2022-08-29 | Disposition: A | Discharge status: Home / Self Care | Payer: BC Managed Care – PPO | Attending: Emergency Medicine | Admitting: Emergency Medicine

## 2022-08-28 DIAGNOSIS — J449 Chronic obstructive pulmonary disease, unspecified: Secondary | ICD-10-CM | POA: Insufficient documentation

## 2022-08-28 DIAGNOSIS — R109 Unspecified abdominal pain: Secondary | ICD-10-CM | POA: Insufficient documentation

## 2022-08-28 DIAGNOSIS — M25551 Pain in right hip: Secondary | ICD-10-CM | POA: Insufficient documentation

## 2022-08-28 DIAGNOSIS — M545 Low back pain, unspecified: Secondary | ICD-10-CM | POA: Insufficient documentation

## 2022-08-28 DIAGNOSIS — M542 Cervicalgia: Secondary | ICD-10-CM | POA: Insufficient documentation

## 2022-08-28 DIAGNOSIS — Z7951 Long term (current) use of inhaled steroids: Secondary | ICD-10-CM | POA: Insufficient documentation

## 2022-08-28 DIAGNOSIS — M7918 Myalgia, other site: Secondary | ICD-10-CM

## 2022-08-28 MED ORDER — ACETAMINOPHEN 325 MG PO TABS
650.0000 mg | ORAL_TABLET | Freq: Once | ORAL | Status: AC
  Administered 2022-08-28: 650 mg via ORAL
  Filled 2022-08-28: qty 2
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
## 2022-08-29 MED ORDER — KETOROLAC TROMETHAMINE 15 MG/ML IJ SOLN
15.0000 mg | Freq: Once | INTRAMUSCULAR | Status: AC
  Administered 2022-08-29: 15 mg via INTRAMUSCULAR
  Filled 2022-08-29: qty 1

## 2022-08-29 MED ORDER — NAPROXEN 500 MG PO TABS: 500.0000 mg | ORAL_TABLET | Freq: Two times a day (BID) | ORAL | 0 refills | Status: AC

## 2022-08-29 MED ORDER — CYCLOBENZAPRINE HCL 10 MG PO TABS: 10.0000 mg | ORAL_TABLET | Freq: Two times a day (BID) | ORAL | 0 refills | Status: AC | PRN
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
## 2024-08-13 ENCOUNTER — Encounter (HOSPITAL_COMMUNITY): Payer: Self-pay | Admitting: Radiology

## 2024-08-13 ENCOUNTER — Emergency Department (HOSPITAL_COMMUNITY)
Admission: EM | Admit: 2024-08-13 | Discharge: 2024-08-14 | Disposition: A | Discharge status: Home / Self Care | Attending: Emergency Medicine | Admitting: Emergency Medicine

## 2024-08-13 ENCOUNTER — Emergency Department (HOSPITAL_COMMUNITY)

## 2024-08-13 ENCOUNTER — Other Ambulatory Visit: Payer: Self-pay

## 2024-08-13 DIAGNOSIS — J449 Chronic obstructive pulmonary disease, unspecified: Secondary | ICD-10-CM | POA: Diagnosis not present

## 2024-08-13 DIAGNOSIS — L03011 Cellulitis of right finger: Secondary | ICD-10-CM | POA: Diagnosis not present

## 2024-08-13 DIAGNOSIS — M7989 Other specified soft tissue disorders: Secondary | ICD-10-CM | POA: Diagnosis present

## 2024-08-13 LAB — CBC WITH DIFFERENTIAL/PLATELET
Abs Immature Granulocytes: 0.03 K/uL (ref 0.00–0.07)
Basophils Absolute: 0 K/uL (ref 0.0–0.1)
Basophils Relative: 0 %
Eosinophils Absolute: 0.1 K/uL (ref 0.0–0.5)
Eosinophils Relative: 1 %
HCT: 39.9 % (ref 39.0–52.0)
Hemoglobin: 14.1 g/dL (ref 13.0–17.0)
Immature Granulocytes: 0 %
Lymphocytes Relative: 18 %
Lymphs Abs: 1.3 K/uL (ref 0.7–4.0)
MCH: 31.6 pg (ref 26.0–34.0)
MCHC: 35.3 g/dL (ref 30.0–36.0)
MCV: 89.5 fL (ref 80.0–100.0)
Monocytes Absolute: 0.3 K/uL (ref 0.1–1.0)
Monocytes Relative: 4 %
Neutro Abs: 5.5 K/uL (ref 1.7–7.7)
Neutrophils Relative %: 77 %
Platelets: 279 K/uL (ref 150–400)
RBC: 4.46 MIL/uL (ref 4.22–5.81)
RDW: 14.1 % (ref 11.5–15.5)
WBC: 7.3 K/uL (ref 4.0–10.5)
nRBC: 0 % (ref 0.0–0.2)

## 2024-08-13 LAB — COMPREHENSIVE METABOLIC PANEL WITH GFR
ALT: 18 U/L (ref 0–44)
AST: 20 U/L (ref 15–41)
Albumin: 3.6 g/dL (ref 3.5–5.0)
Alkaline Phosphatase: 71 U/L (ref 38–126)
Anion gap: 13 (ref 5–15)
BUN: 9 mg/dL (ref 6–20)
CO2: 21 mmol/L — ABNORMAL LOW (ref 22–32)
Calcium: 9 mg/dL (ref 8.9–10.3)
Chloride: 104 mmol/L (ref 98–111)
Creatinine, Ser: 0.94 mg/dL (ref 0.61–1.24)
GFR, Estimated: >60 mL/min (ref >60)
Glucose, Bld: 190 mg/dL — ABNORMAL HIGH (ref 70–99)
Potassium: 3.5 mmol/L (ref 3.5–5.1)
Sodium: 138 mmol/L (ref 135–145)
Total Bilirubin: 0.7 mg/dL (ref 0.0–1.2)
Total Protein: 7 g/dL (ref 6.5–8.1)

## 2024-08-13 NOTE — ED Triage Notes (Signed)
 Right thumb swollen and unable to bend it. States the pain radiates through his entire body. Denies injury.

## 2024-08-13 NOTE — ED Triage Notes (Signed)
 Pt to ER with c/o right thumb swelling, denies injury.

## 2024-08-14 MED ORDER — HYDROCODONE-ACETAMINOPHEN 5-325 MG PO TABS: 1.0000 | ORAL_TABLET | Freq: Four times a day (QID) | ORAL | 0 refills | Status: AC | PRN

## 2024-08-14 MED ORDER — OXYCODONE-ACETAMINOPHEN 5-325 MG PO TABS: 1.0000 | ORAL_TABLET | Freq: Once | ORAL | Status: DC

## 2024-08-14 MED ORDER — DOXYCYCLINE HYCLATE 100 MG PO CAPS: 100.0000 mg | ORAL_CAPSULE | Freq: Two times a day (BID) | ORAL | 0 refills | Status: AC

## 2024-08-14 MED ORDER — IBUPROFEN 400 MG PO TABS
600.0000 mg | ORAL_TABLET | Freq: Once | ORAL | Status: AC
  Administered 2024-08-14: 600 mg via ORAL
  Filled 2024-08-14: qty 1

## 2024-08-14 MED ORDER — LIDOCAINE HCL (PF) 1 % IJ SOLN
5.0000 mL | Freq: Once | INTRAMUSCULAR | Status: AC
  Administered 2024-08-14: 5 mL
  Filled 2024-08-14: qty 5

## 2024-08-14 MED ORDER — FENTANYL CITRATE PF 50 MCG/ML IJ SOSY
50.0000 ug | PREFILLED_SYRINGE | Freq: Once | INTRAMUSCULAR | Status: AC
  Administered 2024-08-14: 50 ug via INTRAMUSCULAR
  Filled 2024-08-14: qty 1

## 2024-08-14 MED ORDER — LIDOCAINE HCL (PF) 1 % IJ SOLN
INTRAMUSCULAR | Status: AC
  Filled 2024-08-14: qty 10

## 2024-08-14 NOTE — Discharge Instructions (Addendum)
 It was a pleasure taking part in your care.  As discussed, please follow-up with Dr. Kuzma in the next 7 days.  Please call his office and make an appointment to be seen.  Please begin taking doxycycline  twice a day for the next 7 days.  This is an antibiotic for infection.  You may take ibuprofen  for pain control and if ibuprofen  does not control pain, take 1 tablet of hydrocodone .  Do not drive, operate machinery, mix hydrocodone  with alcohol.  Please do not submerge your wound in water.  Please read attached guide concerning incision and drainage.  Please keep wound covered.  Packing will fall out on its own.  Return to ED with new symptoms.

## 2024-08-14 NOTE — ED Notes (Signed)
Patient verbalizes understanding of discharge instructions. Opportunity for questioning and answers were provided. Armband removed by staff, pt discharged from ED. Ambulated out to lobby  
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
## 2024-08-18 LAB — AEROBIC CULTURE W GRAM STAIN (SUPERFICIAL SPECIMEN): Gram Stain: NONE SEEN

## 2024-08-19 ENCOUNTER — Telehealth (HOSPITAL_BASED_OUTPATIENT_CLINIC_OR_DEPARTMENT_OTHER): Payer: Self-pay | Admitting: *Deleted

## 2024-08-19 NOTE — Telephone Encounter (Signed)
 Post ED Visit - Positive Culture Follow-up: Unsuccessful Patient Follow-up  Culture assessed and recommendations reviewed by:  [x]  Koren Or, Pharm.D. []  Venetia Gully, Pharm.D., BCPS AQ-ID []  Garrel Crews, Pharm.D., BCPS []  Almarie Lunger, Pharm.D., BCPS []  [place], [place].D., BCPS, AAHIVP []  Rosaline Bihari, Pharm.D., BCPS, AAHIVP []  Massie Rigg, PharmD []  Jodie Rower, PharmD, BCPS  Positive wound culture  []  Patient discharged without antimicrobial prescription and treatment is now indicated [x]  Organism is resistant to prescribed ED discharge antimicrobial []  Patient with positive blood cultures  Plan per Dr. Prentice Medicus: Stop doxycycline  and start Levaquin 750 mg po daily x 7 days; follow up with Dr. Murrell. Unable to contact patient after 3 attempts, letter will be sent to address on file  [name] 08/19/2024, 4:19 PM
